# Patient Record
Sex: Male | Born: 1970 | Race: White | Hispanic: No | Marital: Married | Smoking: Current every day smoker
Health system: Southern US, Community
[De-identification: ages and names within clinical notes are randomized; demographics above are authoritative.]

## PROBLEM LIST (undated history)

## (undated) DIAGNOSIS — J189 Pneumonia, unspecified organism: Secondary | ICD-10-CM

## (undated) DIAGNOSIS — I10 Essential (primary) hypertension: Secondary | ICD-10-CM

## (undated) DIAGNOSIS — K219 Gastro-esophageal reflux disease without esophagitis: Secondary | ICD-10-CM

## (undated) DIAGNOSIS — E785 Hyperlipidemia, unspecified: Secondary | ICD-10-CM

## (undated) DIAGNOSIS — M542 Cervicalgia: Secondary | ICD-10-CM

## (undated) HISTORY — PX: TUMOR EXCISION: SHX421

---

## 2006-04-22 ENCOUNTER — Other Ambulatory Visit: Payer: Self-pay

## 2006-04-22 ENCOUNTER — Inpatient Hospital Stay: Payer: Self-pay | Admitting: Internal Medicine

## 2006-07-11 ENCOUNTER — Emergency Department: Payer: Self-pay | Admitting: Emergency Medicine

## 2008-10-04 ENCOUNTER — Emergency Department: Payer: Self-pay | Admitting: Emergency Medicine

## 2010-07-17 ENCOUNTER — Emergency Department: Payer: Self-pay | Admitting: Emergency Medicine

## 2011-01-12 ENCOUNTER — Emergency Department: Payer: Self-pay | Admitting: Emergency Medicine

## 2011-01-22 ENCOUNTER — Ambulatory Visit: Payer: Self-pay | Admitting: Internal Medicine

## 2011-01-26 ENCOUNTER — Ambulatory Visit: Payer: Self-pay | Admitting: Gastroenterology

## 2011-02-01 ENCOUNTER — Ambulatory Visit: Payer: Self-pay | Admitting: Gastroenterology

## 2011-06-19 ENCOUNTER — Emergency Department: Payer: Self-pay | Admitting: Emergency Medicine

## 2011-06-19 LAB — CBC
HCT: 46.2 % (ref 40.0–52.0)
HGB: 15.9 g/dL (ref 13.0–18.0)
MCH: 31.9 pg (ref 26.0–34.0)
MCHC: 34.4 g/dL (ref 32.0–36.0)
MCV: 93 fL (ref 80–100)
Platelet: 183 10*3/uL (ref 150–440)
RBC: 4.99 10*6/uL (ref 4.40–5.90)
WBC: 8.6 10*3/uL (ref 3.8–10.6)

## 2011-06-19 LAB — COMPREHENSIVE METABOLIC PANEL
Albumin: 4.1 g/dL (ref 3.4–5.0)
Alkaline Phosphatase: 105 U/L (ref 50–136)
Anion Gap: 12 (ref 7–16)
BUN: 12 mg/dL (ref 7–18)
Calcium, Total: 9.1 mg/dL (ref 8.5–10.1)
Creatinine: 0.99 mg/dL (ref 0.60–1.30)
Glucose: 103 mg/dL — ABNORMAL HIGH (ref 65–99)
Potassium: 3.3 mmol/L — ABNORMAL LOW (ref 3.5–5.1)
SGOT(AST): 20 U/L (ref 15–37)
SGPT (ALT): 18 U/L
Total Protein: 7.6 g/dL (ref 6.4–8.2)

## 2011-06-19 LAB — TROPONIN I: Troponin-I: <0.02 ng/mL

## 2011-06-19 LAB — CK TOTAL AND CKMB (NOT AT ARMC)
CK, Total: 72 U/L (ref 35–232)
CK-MB: <0.5 ng/mL — ABNORMAL LOW (ref 0.5–3.6)

## 2011-06-19 LAB — MAGNESIUM: Magnesium: 1.7 mg/dL — ABNORMAL LOW

## 2011-09-11 ENCOUNTER — Emergency Department: Payer: Self-pay | Admitting: Emergency Medicine

## 2011-09-11 LAB — BASIC METABOLIC PANEL
Anion Gap: 10 (ref 7–16)
BUN: 9 mg/dL (ref 7–18)
Calcium, Total: 9.1 mg/dL (ref 8.5–10.1)
Chloride: 109 mmol/L — ABNORMAL HIGH (ref 98–107)
Creatinine: 1.1 mg/dL (ref 0.60–1.30)
Glucose: 113 mg/dL — ABNORMAL HIGH (ref 65–99)
Osmolality: 286 (ref 275–301)

## 2011-09-11 LAB — CK TOTAL AND CKMB (NOT AT ARMC)
CK, Total: 115 U/L (ref 35–232)
CK-MB: <0.5 ng/mL — ABNORMAL LOW (ref 0.5–3.6)

## 2011-09-11 LAB — CBC
HGB: 15.5 g/dL (ref 13.0–18.0)
MCH: 32.8 pg (ref 26.0–34.0)
MCHC: 35 g/dL (ref 32.0–36.0)
MCV: 94 fL (ref 80–100)
RBC: 4.74 10*6/uL (ref 4.40–5.90)
WBC: 10.3 10*3/uL (ref 3.8–10.6)

## 2012-08-24 ENCOUNTER — Ambulatory Visit: Payer: Self-pay | Admitting: Internal Medicine

## 2013-01-15 ENCOUNTER — Emergency Department: Payer: Self-pay | Admitting: Emergency Medicine

## 2013-01-15 LAB — CBC
HGB: 16 g/dL (ref 13.0–18.0)
MCH: 32.8 pg (ref 26.0–34.0)
MCHC: 35.4 g/dL (ref 32.0–36.0)
MCV: 93 fL (ref 80–100)
RBC: 4.87 10*6/uL (ref 4.40–5.90)
RDW: 13.4 % (ref 11.5–14.5)

## 2013-01-15 LAB — COMPREHENSIVE METABOLIC PANEL
Albumin: 3.9 g/dL (ref 3.4–5.0)
Alkaline Phosphatase: 109 U/L (ref 50–136)
Anion Gap: 8 (ref 7–16)
Calcium, Total: 9.3 mg/dL (ref 8.5–10.1)
Chloride: 104 mmol/L (ref 98–107)
Creatinine: 0.94 mg/dL (ref 0.60–1.30)
EGFR (Non-African Amer.): >60
Glucose: 110 mg/dL — ABNORMAL HIGH (ref 65–99)
Osmolality: 277 (ref 275–301)
Potassium: 3.5 mmol/L (ref 3.5–5.1)
SGOT(AST): 12 U/L — ABNORMAL LOW (ref 15–37)
Sodium: 139 mmol/L (ref 136–145)
Total Protein: 7 g/dL (ref 6.4–8.2)

## 2013-01-15 LAB — TROPONIN I
Troponin-I: <0.02 ng/mL
Troponin-I: <0.02 ng/mL

## 2013-05-02 ENCOUNTER — Emergency Department: Payer: Self-pay | Admitting: Emergency Medicine

## 2013-05-02 LAB — CBC
HCT: 45.3 % (ref 40.0–52.0)
HGB: 15.7 g/dL (ref 13.0–18.0)
MCH: 32.8 pg (ref 26.0–34.0)
MCHC: 34.7 g/dL (ref 32.0–36.0)
MCV: 95 fL (ref 80–100)
PLATELETS: 179 10*3/uL (ref 150–440)
RBC: 4.8 10*6/uL (ref 4.40–5.90)
RDW: 13.6 % (ref 11.5–14.5)
WBC: 8.5 10*3/uL (ref 3.8–10.6)

## 2013-05-02 LAB — BASIC METABOLIC PANEL
Anion Gap: 7 (ref 7–16)
BUN: 7 mg/dL (ref 7–18)
CO2: 26 mmol/L (ref 21–32)
Calcium, Total: 9.1 mg/dL (ref 8.5–10.1)
Chloride: 106 mmol/L (ref 98–107)
Creatinine: 0.92 mg/dL (ref 0.60–1.30)
EGFR (Non-African Amer.): >60
Glucose: 106 mg/dL — ABNORMAL HIGH (ref 65–99)
Osmolality: 276 (ref 275–301)
POTASSIUM: 3.3 mmol/L — AB (ref 3.5–5.1)
Sodium: 139 mmol/L (ref 136–145)

## 2013-05-02 LAB — TROPONIN I: Troponin-I: <0.02 ng/mL

## 2013-05-20 ENCOUNTER — Emergency Department: Payer: Self-pay

## 2014-06-10 DIAGNOSIS — E785 Hyperlipidemia, unspecified: Secondary | ICD-10-CM | POA: Insufficient documentation

## 2014-06-10 DIAGNOSIS — G8929 Other chronic pain: Secondary | ICD-10-CM | POA: Insufficient documentation

## 2014-06-10 DIAGNOSIS — M542 Cervicalgia: Secondary | ICD-10-CM

## 2014-06-10 DIAGNOSIS — Z8711 Personal history of peptic ulcer disease: Secondary | ICD-10-CM | POA: Insufficient documentation

## 2015-01-13 ENCOUNTER — Ambulatory Visit
Admission: RE | Admit: 2015-01-13 | Discharge: 2015-01-13 | Disposition: A | Discharge status: Home / Self Care | Payer: BC Managed Care – PPO | Source: Ambulatory Visit | Attending: Physician Assistant | Admitting: Physician Assistant

## 2015-01-13 ENCOUNTER — Other Ambulatory Visit: Payer: Self-pay | Admitting: Physician Assistant

## 2015-01-13 DIAGNOSIS — I700 Atherosclerosis of aorta: Secondary | ICD-10-CM | POA: Diagnosis not present

## 2015-01-13 DIAGNOSIS — K449 Diaphragmatic hernia without obstruction or gangrene: Secondary | ICD-10-CM | POA: Insufficient documentation

## 2015-01-13 DIAGNOSIS — R1031 Right lower quadrant pain: Secondary | ICD-10-CM

## 2015-01-13 DIAGNOSIS — R1030 Lower abdominal pain, unspecified: Secondary | ICD-10-CM

## 2015-01-13 DIAGNOSIS — K5730 Diverticulosis of large intestine without perforation or abscess without bleeding: Secondary | ICD-10-CM | POA: Insufficient documentation

## 2015-01-13 DIAGNOSIS — R102 Pelvic and perineal pain: Secondary | ICD-10-CM

## 2015-01-13 MED ORDER — IOHEXOL 300 MG/ML  SOLN
100.0000 mL | Freq: Once | INTRAMUSCULAR | Status: AC | PRN
  Administered 2015-01-13: 100 mL via INTRAVENOUS

## 2015-02-02 IMAGING — US THYROID ULTRASOUND
1 series · 14 of 25 positions shown · non-contrast
Comparison: none

REASON FOR EXAM: Left Lower Lobe Tenderness Hx Thyroid Nodule
COMMENTS:

PROCEDURE:     TIGER - TIGER SOFT TISSUE HEAD/NECK/THYROI  - August 24, 2012  [DATE]
RESULT:     Comparison: None
TECHNIQUE: Multiple gray-scale and color-flow Doppler images of the thyroid
gland were obtained.

[Series 1: thyroid ultrasound · 0.08mm/px · 14 of 51 slices shown]
[im 1/51]
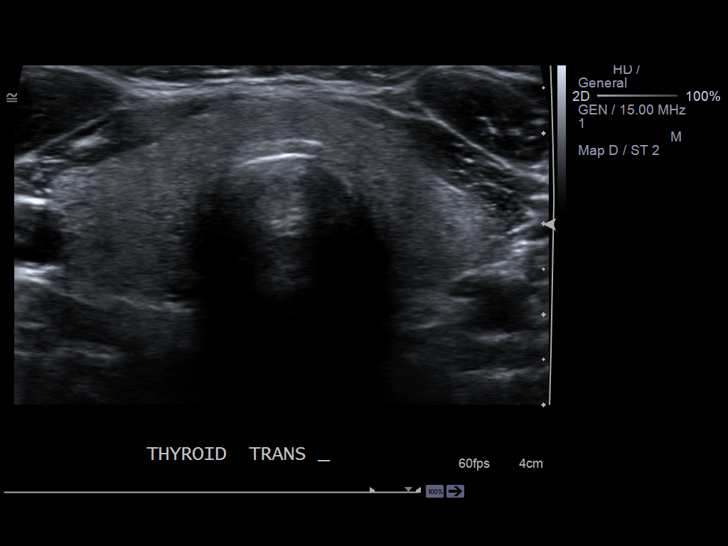
[im 5/51]
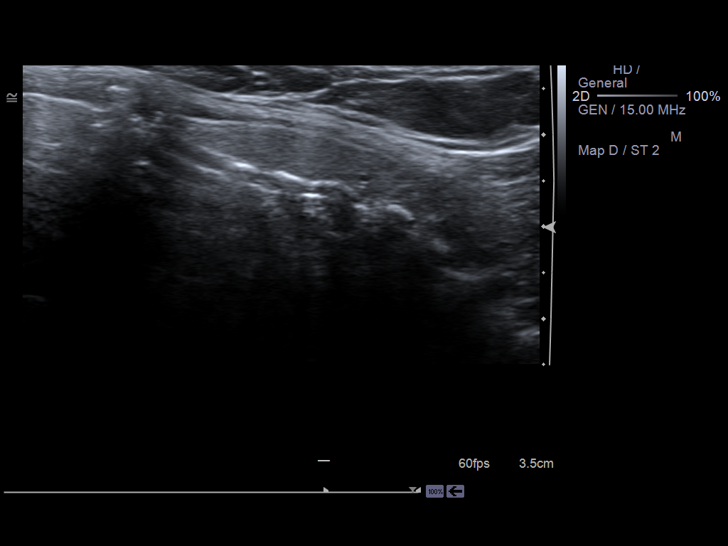
[im 9/51]
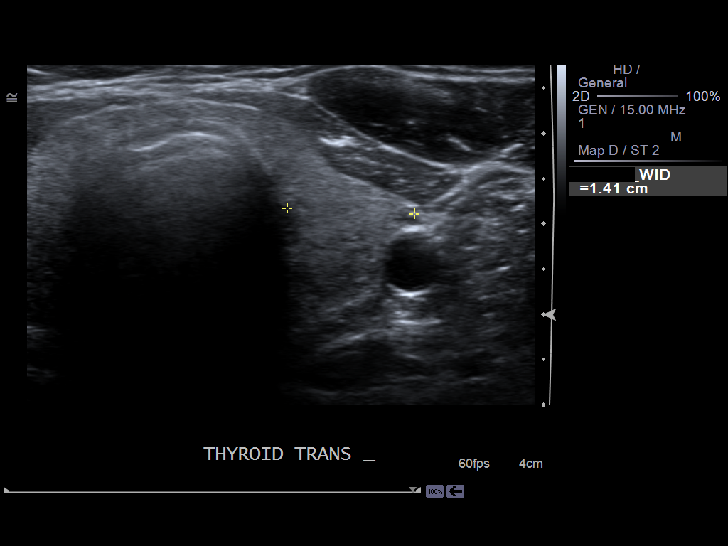
[im 13/51]
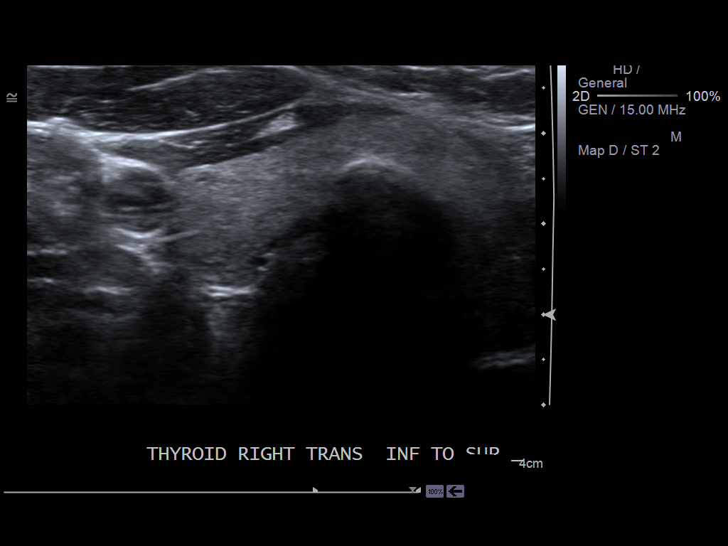
[im 17/51]
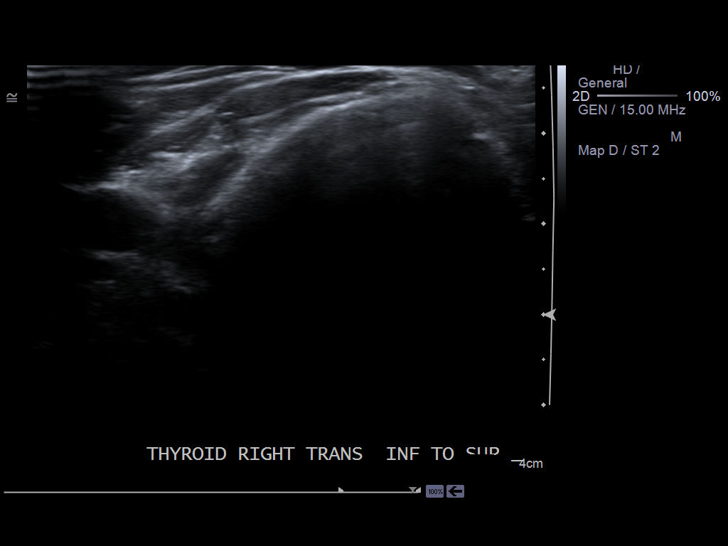
[im 19/51]
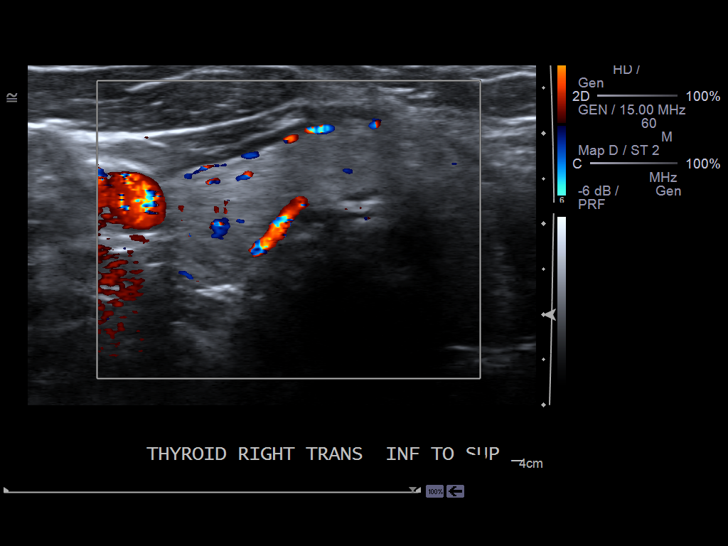
[im 23/51]
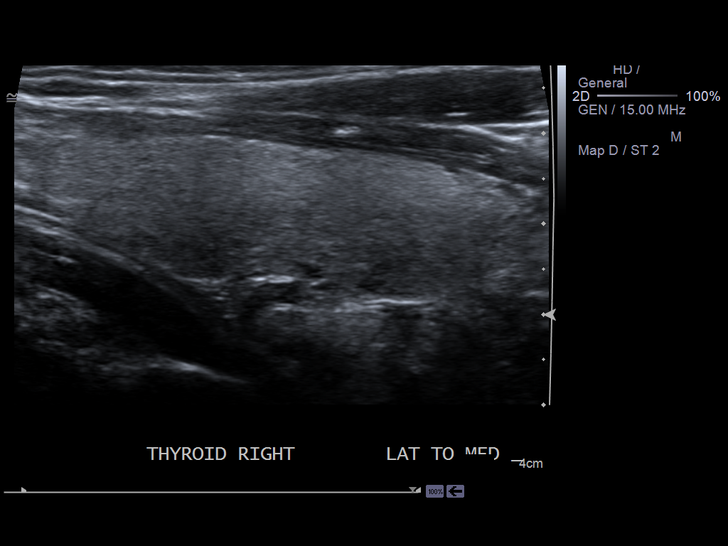
[im 28/51]
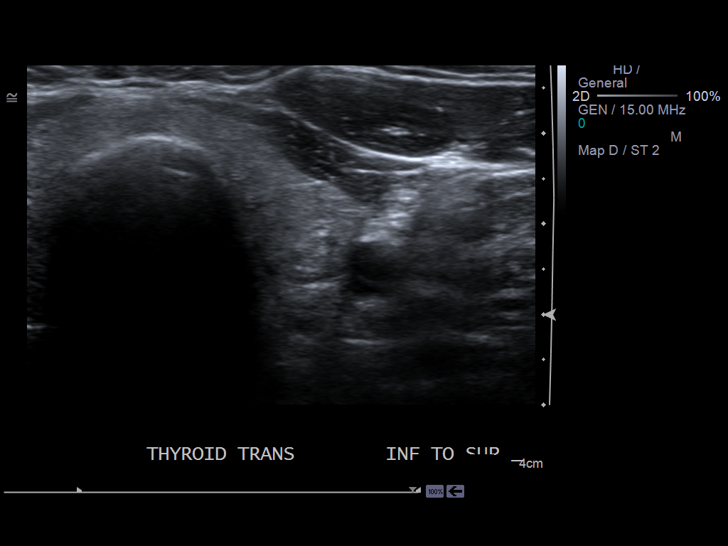
[im 32/51]
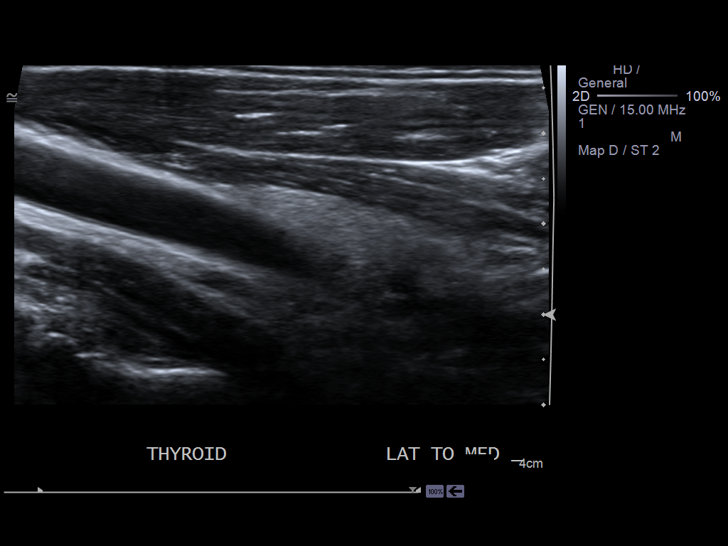
[im 34/51]
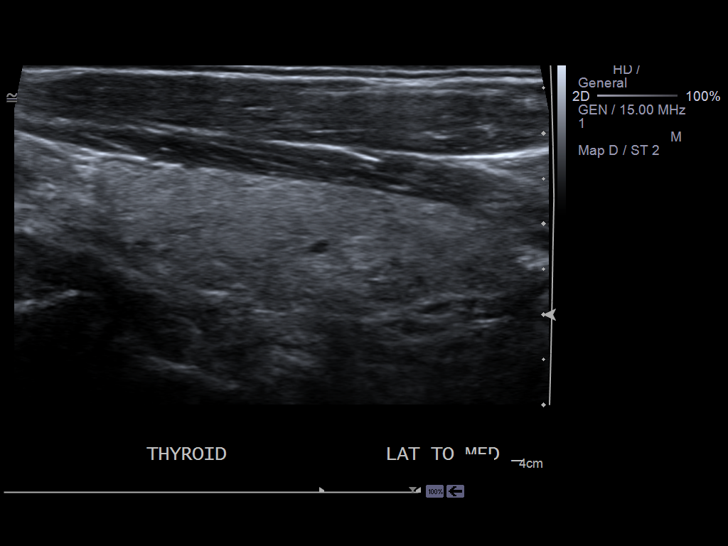
[im 38/51]
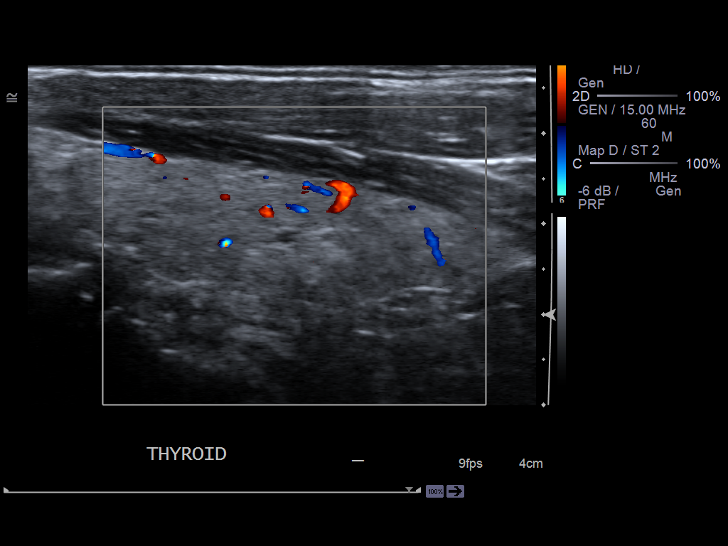
[im 42/51]
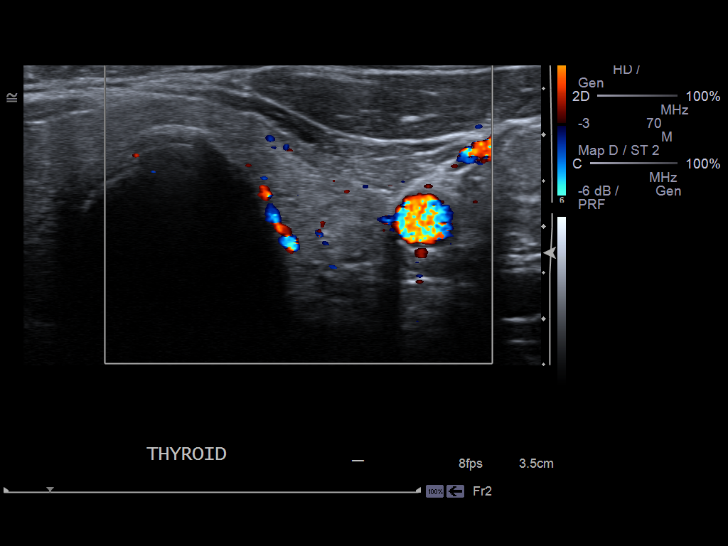
[im 46/51]
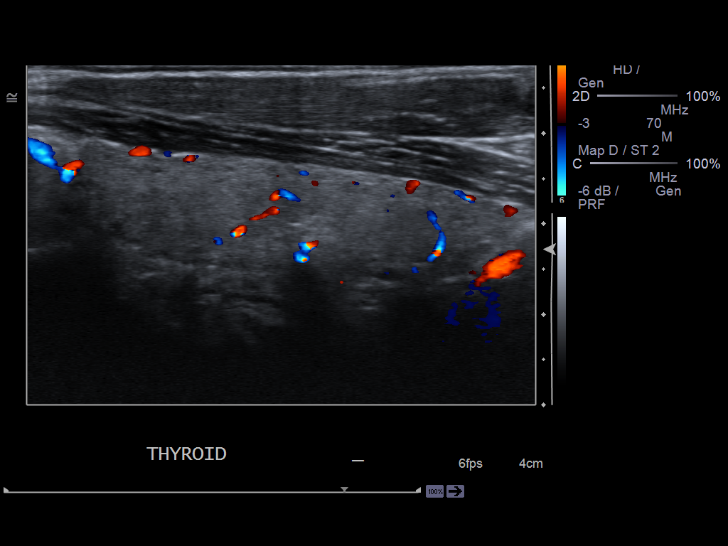
[im 51/51]
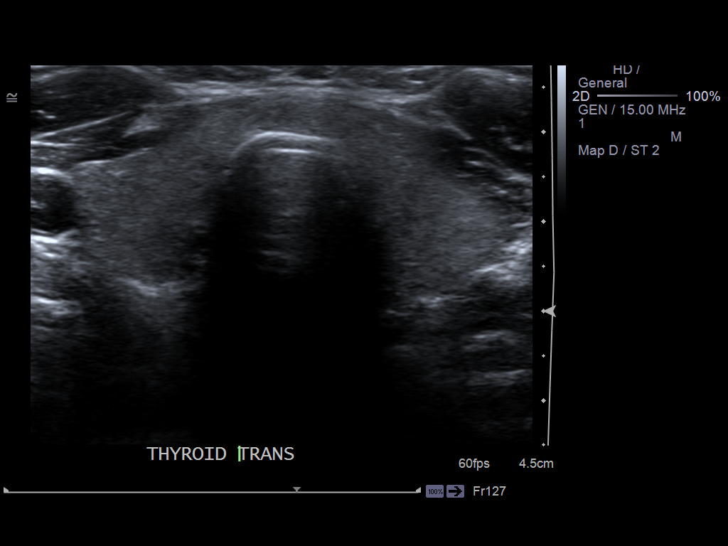

[14 of 25 positions shown; findings below may reference images not displayed]

FINDINGS: The right lobe the thyroid measures 5.8 x 1.6 x 1.5 cm. The left lobe of the
thyroid measures 5.4 x 1.7 x 1.4 cm. The thyroid isthmus measures 0.5 cm in
thickness. The thyroid gland is homogeneous in echotexture. There is a small
solid slightly hyperechoic mass in the inferior pole of the left lobe of the
thyroid. It measures 0.7 x 0.5 x 0.5 cm.
IMPRESSION: Subcentimeter solid nodule in the left lobe of the thyroid gland, which is
nonspecific.

Please note, there are no sensitive or specific ultrasound criteria for
differentiating benign from malignant thyroid lesions.

[REDACTED]

## 2017-02-16 ENCOUNTER — Encounter: Payer: Self-pay | Admitting: Emergency Medicine

## 2017-02-16 ENCOUNTER — Emergency Department: Payer: BC Managed Care – PPO

## 2017-02-16 ENCOUNTER — Observation Stay
Admission: EM | Admit: 2017-02-16 | Discharge: 2017-02-17 | Disposition: A | Discharge status: Home / Self Care | Payer: BC Managed Care – PPO | Attending: Internal Medicine | Admitting: Internal Medicine

## 2017-02-16 ENCOUNTER — Other Ambulatory Visit: Payer: Self-pay

## 2017-02-16 DIAGNOSIS — G629 Polyneuropathy, unspecified: Secondary | ICD-10-CM | POA: Diagnosis not present

## 2017-02-16 DIAGNOSIS — E785 Hyperlipidemia, unspecified: Secondary | ICD-10-CM | POA: Diagnosis not present

## 2017-02-16 DIAGNOSIS — F17210 Nicotine dependence, cigarettes, uncomplicated: Secondary | ICD-10-CM | POA: Insufficient documentation

## 2017-02-16 DIAGNOSIS — R0789 Other chest pain: Secondary | ICD-10-CM | POA: Diagnosis not present

## 2017-02-16 DIAGNOSIS — J9811 Atelectasis: Secondary | ICD-10-CM | POA: Insufficient documentation

## 2017-02-16 DIAGNOSIS — R0602 Shortness of breath: Secondary | ICD-10-CM | POA: Insufficient documentation

## 2017-02-16 DIAGNOSIS — K219 Gastro-esophageal reflux disease without esophagitis: Secondary | ICD-10-CM | POA: Diagnosis present

## 2017-02-16 DIAGNOSIS — R079 Chest pain, unspecified: Secondary | ICD-10-CM | POA: Diagnosis present

## 2017-02-16 DIAGNOSIS — Z79899 Other long term (current) drug therapy: Secondary | ICD-10-CM | POA: Insufficient documentation

## 2017-02-16 HISTORY — DX: Hyperlipidemia, unspecified: E78.5

## 2017-02-16 HISTORY — DX: Gastro-esophageal reflux disease without esophagitis: K21.9

## 2017-02-16 HISTORY — DX: Cervicalgia: M54.2

## 2017-02-16 LAB — BASIC METABOLIC PANEL
ANION GAP: 12 (ref 5–15)
BUN: 6 mg/dL (ref 6–20)
CALCIUM: 9.7 mg/dL (ref 8.9–10.3)
CO2: 22 mmol/L (ref 22–32)
CREATININE: 0.84 mg/dL (ref 0.61–1.24)
Chloride: 103 mmol/L (ref 101–111)
GFR calc Af Amer: >60 mL/min (ref >60)
GFR calc non Af Amer: >60 mL/min (ref >60)
GLUCOSE: 136 mg/dL — AB (ref 65–99)
Potassium: 3.3 mmol/L — ABNORMAL LOW (ref 3.5–5.1)
Sodium: 137 mmol/L (ref 135–145)

## 2017-02-16 LAB — TROPONIN I

## 2017-02-16 LAB — CBC
HCT: 49.2 % (ref 40.0–52.0)
HEMOGLOBIN: 16.6 g/dL (ref 13.0–18.0)
MCH: 31.7 pg (ref 26.0–34.0)
MCHC: 33.7 g/dL (ref 32.0–36.0)
MCV: 94.1 fL (ref 80.0–100.0)
Platelets: UNDETERMINED 10*3/uL (ref 150–440)
RBC: 5.23 MIL/uL (ref 4.40–5.90)
RDW: 13.7 % (ref 11.5–14.5)
WBC: 11.6 10*3/uL — ABNORMAL HIGH (ref 3.8–10.6)

## 2017-02-16 MED ORDER — IOPAMIDOL (ISOVUE-370) INJECTION 76%
100.0000 mL | Freq: Once | INTRAVENOUS | Status: AC | PRN
  Administered 2017-02-16: 100 mL via INTRAVENOUS

## 2017-02-16 MED ORDER — OXYCODONE HCL 5 MG PO TABS: 5.0000 mg | ORAL_TABLET | ORAL | Status: DC | PRN

## 2017-02-16 MED ORDER — ACETAMINOPHEN 650 MG RE SUPP: 650.0000 mg | Freq: Four times a day (QID) | RECTAL | Status: DC | PRN

## 2017-02-16 MED ORDER — SODIUM CHLORIDE 0.9 % IV BOLUS (SEPSIS)
500.0000 mL | Freq: Once | INTRAVENOUS | Status: AC
  Administered 2017-02-16: 500 mL via INTRAVENOUS

## 2017-02-16 MED ORDER — ENOXAPARIN SODIUM 40 MG/0.4ML ~~LOC~~ SOLN: 40.0000 mg | SUBCUTANEOUS | Status: DC

## 2017-02-16 MED ORDER — PANTOPRAZOLE SODIUM 40 MG PO TBEC
40.0000 mg | DELAYED_RELEASE_TABLET | Freq: Two times a day (BID) | ORAL | Status: DC
  Administered 2017-02-17 (×2): 40 mg via ORAL
  Filled 2017-02-16 (×2): qty 1

## 2017-02-16 MED ORDER — ACETAMINOPHEN 325 MG PO TABS: 650.0000 mg | ORAL_TABLET | Freq: Four times a day (QID) | ORAL | Status: DC | PRN

## 2017-02-16 MED ORDER — NITROGLYCERIN 0.4 MG SL SUBL
0.4000 mg | SUBLINGUAL_TABLET | SUBLINGUAL | Status: DC | PRN
  Administered 2017-02-16 – 2017-02-17 (×4): 0.4 mg via SUBLINGUAL
  Filled 2017-02-16 (×3): qty 1

## 2017-02-16 MED ORDER — GABAPENTIN 100 MG PO CAPS
100.0000 mg | ORAL_CAPSULE | Freq: Two times a day (BID) | ORAL | Status: DC
Start: 2017-02-17 — End: 2017-02-17
  Administered 2017-02-17 (×2): 100 mg via ORAL
  Filled 2017-02-16 (×2): qty 1

## 2017-02-16 MED ORDER — ONDANSETRON HCL 4 MG PO TABS: 4.0000 mg | ORAL_TABLET | Freq: Four times a day (QID) | ORAL | Status: DC | PRN

## 2017-02-16 MED ORDER — ASPIRIN 81 MG PO CHEW
324.0000 mg | CHEWABLE_TABLET | Freq: Once | ORAL | Status: AC
  Administered 2017-02-16: 324 mg via ORAL
  Filled 2017-02-16: qty 4

## 2017-02-16 MED ORDER — ONDANSETRON HCL 4 MG/2ML IJ SOLN
4.0000 mg | Freq: Once | INTRAMUSCULAR | Status: DC
  Filled 2017-02-16: qty 2

## 2017-02-16 MED ORDER — ONDANSETRON HCL 4 MG/2ML IJ SOLN: 4.0000 mg | Freq: Four times a day (QID) | INTRAMUSCULAR | Status: DC | PRN

## 2017-02-16 MED ORDER — MORPHINE SULFATE (PF) 4 MG/ML IV SOLN
4.0000 mg | Freq: Once | INTRAVENOUS | Status: DC
  Filled 2017-02-16: qty 1

## 2017-02-16 NOTE — ED Triage Notes (Signed)
Patient with complaint of chest pain with shortness of breath and vomiting that started about an hour ago.

## 2017-02-16 NOTE — H&P (Signed)
[facility] -  at [facility]   PATIENT NAME: Raymond Hamilton    MR#:  [id number]  DATE OF BIRTH:  1971/01/22  DATE OF ADMISSION:  02/16/2017  PRIMARY CARE PHYSICIAN: Rafael Bihari, MD   REQUESTING/REFERRING PHYSICIAN: Pershing Proud, MD  CHIEF COMPLAINT:   Chief Complaint  Patient presents with  . Chest Pain    HISTORY OF PRESENT ILLNESS:  Raymond Hamilton  is a 46 y.o. male who presents with persistent chest pain.  Patient states his chest pain started yesterday evening, he was not doing any particular activity.  It eased off some but then today came back more intensely.  It was a central chest pain initially, and aching pain, radiated down to his left lateral side and then around to his back.  Initial workup here in the ED is largely within normal limits.  Patient does have a past history of reflux, but he states that this pain is different from any reflux pain he has had before.  Hospitalist were called for admission and further evaluation  PAST MEDICAL HISTORY:   Past Medical History:  Diagnosis Date  . GERD (gastroesophageal reflux disease)   . HLD (hyperlipidemia)   . Neck pain     PAST SURGICAL HISTORY:   Past Surgical History:  Procedure Laterality Date  . TUMOR EXCISION      SOCIAL HISTORY:   Social History   Tobacco Use  . Smoking status: Current Every Day Smoker  . Smokeless tobacco: Never Used  Substance Use Topics  . Alcohol use: Yes    Comment: occ    FAMILY HISTORY:   Family History  Problem Relation Age of Onset  . Hypertension Mother   . Diabetes Father     DRUG ALLERGIES:  No Known Allergies  MEDICATIONS AT HOME:   Prior to Admission medications   Medication Sig Start Date End Date Taking? Authorizing Provider  omeprazole (PRILOSEC) 40 MG capsule Take 1 capsule 2 (two) times daily by mouth. 10/01/16 10/01/17 Yes [provider]  gabapentin (NEURONTIN) 100 MG capsule TAKE 1 CAPSULE (100 MG TOTAL) BY  MOUTH 2 (TWO) TIMES DAILY. 12/22/16   [provider]    REVIEW OF SYSTEMS:  Review of Systems  Constitutional: Negative for chills, fever, malaise/fatigue and weight loss.  HENT: Negative for ear pain, hearing loss and tinnitus.   Eyes: Negative for blurred vision, double vision, pain and redness.  Respiratory: Negative for cough, hemoptysis and shortness of breath.   Cardiovascular: Positive for chest pain. Negative for palpitations, orthopnea and leg swelling.  Gastrointestinal: Negative for abdominal pain, constipation, diarrhea, nausea and vomiting.  Genitourinary: Negative for dysuria, frequency and hematuria.  Musculoskeletal: Negative for back pain, joint pain and neck pain.  Skin:       No acne, rash, or lesions  Neurological: Negative for dizziness, tremors, focal weakness and weakness.  Endo/Heme/Allergies: Negative for polydipsia. Does not bruise/bleed easily.  Psychiatric/Behavioral: Negative for depression. The patient is not nervous/anxious and does not have insomnia.      VITAL SIGNS:   Vitals:   02/16/17 2004  BP: (!) 157/90  Pulse: 94  Resp: (!) 22  Temp: 97.8 F (36.6 C)  TempSrc: Oral  SpO2: 99%  Weight: 89.4 kg (197 lb)  Height: 6\' 1"  (1.854 m)   Wt Readings from Last 3 Encounters:  02/16/17 89.4 kg (197 lb)    PHYSICAL EXAMINATION:  Physical Exam  Vitals reviewed. Constitutional: He is oriented to person, place, and  time. He appears well-developed and well-nourished. No distress.  HENT:  Head: Normocephalic and atraumatic.  Mouth/Throat: Oropharynx is clear and moist.  Eyes: Conjunctivae and EOM are normal. Pupils are equal, round, and reactive to light. No scleral icterus.  Neck: Normal range of motion. Neck supple. No JVD present. No thyromegaly present.  Cardiovascular: Normal rate, regular rhythm and intact distal pulses. Exam reveals no gallop and no friction rub.  No murmur heard. Respiratory: Effort normal and breath sounds normal.  No respiratory distress. He has no wheezes. He has no rales.  GI: Soft. Bowel sounds are normal. He exhibits no distension. There is no tenderness.  Musculoskeletal: Normal range of motion. He exhibits no edema.  No arthritis, no gout  Lymphadenopathy:    He has no cervical adenopathy.  Neurological: He is alert and oriented to person, place, and time. No cranial nerve deficit.  No dysarthria, no aphasia  Skin: Skin is warm and dry. No rash noted. No erythema.  Psychiatric: He has a normal mood and affect. His behavior is normal. Judgment and thought content normal.    LABORATORY PANEL:   CBC Recent Labs  Lab 02/16/17 2020  WBC 11.6*  HGB 16.6  HCT 49.2  PLT PLATELET CLUMPS NOTED ON SMEAR, UNABLE TO ESTIMATE   ------------------------------------------------------------------------------------------------------------------  Chemistries  Recent Labs  Lab 02/16/17 2020  NA 137  K 3.3*  CL 103  CO2 22  GLUCOSE 136*  BUN 6  CREATININE 0.84  CALCIUM 9.7   ------------------------------------------------------------------------------------------------------------------  Cardiac Enzymes Recent Labs  Lab 02/16/17 2020  TROPONINI <0.03   ------------------------------------------------------------------------------------------------------------------  RADIOLOGY:  Dg Chest 2 View  Result Date: 02/16/2017 CLINICAL DATA:  Chest pain radiating into the left side with shortness of breath and vomiting today. History of smoking and gastroesophageal reflux disease. EXAM: CHEST  2 VIEW COMPARISON:  Chest radiographs 05/02/2013 and 01/15/2013. FINDINGS: The heart size and mediastinal contours are normal. The lungs are clear. There is no pleural effusion or pneumothorax. No acute osseous findings are identified. IMPRESSION: Stable chest.  No active cardiopulmonary process. Electronically Signed   By: Carey BullocksWilliam  Veazey M.D.   On: 02/16/2017 20:23   Ct Angio Chest Aorta W And/or Wo  Contrast  Result Date: 02/16/2017 CLINICAL DATA:  Acute onset of generalized chest pain, shortness of breath and vomiting. EXAM: CT ANGIOGRAPHY CHEST WITH CONTRAST TECHNIQUE: Multidetector CT imaging of the chest was performed using the standard protocol during bolus administration of intravenous contrast. Multiplanar CT image reconstructions and MIPs were obtained to evaluate the vascular anatomy. CONTRAST:  100mL ISOVUE-370 IOPAMIDOL (ISOVUE-370) INJECTION 76% COMPARISON:  Chest radiograph performed earlier today at 8:10 p.m., and CT of the chest performed 06/19/2011 FINDINGS: Cardiovascular: There is no evidence of aortic dissection. There is no evidence of aneurysmal dilatation. There is no evidence of significant pulmonary embolus. The heart is normal in size. The thoracic aorta is grossly unremarkable. The great vessels are within normal limits. Mediastinum/Nodes: The mediastinum is unremarkable appearance. No mediastinal lymphadenopathy is seen. No pericardial effusion is identified. The visualized portions of the thyroid gland are unremarkable. No axillary lymphadenopathy is seen. There is mild nonspecific wall thickening at the distal esophagus. Lungs/Pleura: A small lymph node is noted along the superior aspect of the left major fissure. Minimal left basilar atelectasis is noted. No pleural effusion or pneumothorax is seen. No masses are identified. Upper Abdomen: The visualized portions of the liver and spleen are unremarkable. The visualized portions of the gallbladder, pancreas, adrenal glands and kidneys are  within normal limits. Musculoskeletal: No acute osseous abnormalities are identified. The visualized musculature is unremarkable in appearance. Review of the MIP images confirms the above findings. IMPRESSION: 1. No evidence of aortic dissection. No evidence of aneurysmal dilatation. 2. No evidence of significant pulmonary embolus. 3. Minimal left basilar atelectasis.  Lungs otherwise clear. 4.  Mild nonspecific wall thickening at the distal esophagus. This may reflect chronic inflammation. Would correlate clinically for any evidence of esophagitis. Electronically Signed   By: Roanna RaiderJeffery  Chang M.D.   On: 02/16/2017 22:25    EKG:   Orders placed or performed during the hospital encounter of 02/16/17  . EKG 12-Lead  . EKG 12-Lead  . ED EKG within 10 minutes  . ED EKG within 10 minutes    IMPRESSION AND PLAN:  Principal Problem:   Chest pain -initial troponin negative, will cycle his cardiac enzymes, get an echocardiogram and a cardiology consult Active Problems:   GERD (gastroesophageal reflux disease) -home dose PPI  All the records are reviewed and case discussed with ED provider. Management plans discussed with the patient and/or family.  DVT PROPHYLAXIS: SubQ lovenox  GI PROPHYLAXIS: PPI  ADMISSION STATUS: Observation  CODE STATUS: Full Code Status History    This patient does not have a recorded code status. Please follow your organizational policy for patients in this situation.      TOTAL TIME TAKING CARE OF THIS PATIENT: 40 minutes.   Foy Vanduyne FIELDING 02/16/2017, 10:32 PM  [facility]  Office  [phone]  CC: Primary care physician; Rafael BihariWalker, [name], MD  Note:  This document was prepared using Dragon voice recognition software and may include unintentional dictation errors.

## 2017-02-16 NOTE — ED Provider Notes (Addendum)
[facility] Emergency Department Provider Note  ____________________________________________   First MD Initiated Contact with Patient 02/16/17 2021     (approximate)  I have reviewed the triage vital signs and the nursing notes.   HISTORY  Chief Complaint Chest Pain   HPI Raymond Hamilton is a 46 y.o. male with a history of GERD and neck pain was presented to the emergency department with chest pain across his chest and radiating to his left back and flank over the past 1 day.  He says the pain feels like a pressure and is a 10 out of 10 at this time.  Associated with shortness of breath.  Nausea but no vomiting.  Says that it gets worse when he walks.  No history of cardiac disease but says that his mother had peripheral vascular disease.  Patient denies any injury or heavy lifting.  Says that he smokes but does not use any drugs and does not drink.  Says that the pain has been coming and going but this last episode that is a 10 out of 10 started about an hour ago.  Past Medical History:  Diagnosis Date  . GERD (gastroesophageal reflux disease)   . Neck pain     There are no active problems to display for this patient.   No past surgical history on file.  Prior to Admission medications   Not on File    Allergies Patient has no known allergies.  No family history on file.  Social History Social History   Tobacco Use  . Smoking status: Current Every Day Smoker  . Smokeless tobacco: Never Used  Substance Use Topics  . Alcohol use: Yes    Comment: occ  . Drug use: No    Review of Systems  Constitutional: No fever/chills Eyes: No visual changes. ENT: No sore throat. Cardiovascular: Above Respiratory: Above Gastrointestinal: No abdominal pain.   no vomiting.  No diarrhea.  No constipation. Genitourinary: Negative for dysuria. Musculoskeletal: Above Skin: Negative for rash. Neurological: Negative for headaches, focal weakness or  numbness.   ____________________________________________   PHYSICAL EXAM:  VITAL SIGNS: ED Triage Vitals [02/16/17 2004]  Enc Vitals Group     BP (!) 157/90     Pulse Rate 94     Resp (!) 22     Temp 97.8 F (36.6 C)     Temp Source Oral     SpO2 99 %     Weight 197 lb (89.4 kg)     Height 6\' 1"  (1.854 m)     Head Circumference      Peak Flow      Pain Score 10     Pain Loc      Pain Edu?      Excl. in GC?     Constitutional: Alert and oriented. in no acute distress. Eyes: Conjunctivae are normal.  Head: Atraumatic. Nose: No congestion/rhinnorhea. Mouth/Throat: Mucous membranes are moist.  Neck: No stridor.   Cardiovascular: Normal rate, regular rhythm. Grossly normal heart sounds.  Good peripheral circulation with equal, bilateral radial as well as dorsalis pedis pulses.  Chest very tender to palpation across the anterior chest.  No crepitus.  No deformity.  Respiratory: Normal respiratory effort.  No retractions. Lungs CTAB. Gastrointestinal: Soft and nontender. No distention. Musculoskeletal: No lower extremity tenderness nor edema.  No joint effusions. Neurologic:  Normal speech and language. No gross focal neurologic deficits are appreciated. Skin:  Skin is warm, dry and intact. No rash  noted. Psychiatric: Mood and affect are normal. Speech and behavior are normal.  ____________________________________________   LABS (all labs ordered are listed, but only abnormal results are displayed)  Labs Reviewed  BASIC METABOLIC PANEL - Abnormal; Notable for the following components:      Result Value   Potassium 3.3 (*)    Glucose, Bld 136 (*)    All other components within normal limits  CBC - Abnormal; Notable for the following components:   WBC 11.6 (*)    All other components within normal limits  TROPONIN I   ____________________________________________  EKG  ED ECG REPORT I, Arelia LongestSchaevitz,  David M, the attending physician, personally viewed and  interpreted this ECG.   Date: 02/16/2017  EKG Time: 2001  Rate: 99  Rhythm: normal sinus rhythm  Axis: Normal  Intervals:none  ST&T Change: No ST segment elevation or depression.  No abnormal T wave inversion.  ED ECG REPORT I, Arelia LongestSchaevitz,  David M, the attending physician, personally viewed and interpreted this ECG.   Date: 02/16/2017  EKG Time: 2038  Rate: 84  Rhythm: normal sinus rhythm  Axis: Normal  Intervals:none  ST&T Change: No ST segment elevation or depression.  No abnormal T wave inversion.  EKG machine read as atrial flutter but this is likely related to baseline static.  ____________________________________________  RADIOLOGY  No acute finding on the chest x-ray ____________________________________________   PROCEDURES  Procedure(s) performed:   Procedures  Critical Care performed:   ____________________________________________   INITIAL IMPRESSION / ASSESSMENT AND PLAN / ED COURSE  Pertinent labs & imaging results that were available during my care of the patient were reviewed by me and considered in my medical decision making (see chart for details).  Differential diagnosis includes, but is not limited to, ACS, aortic dissection, pulmonary embolism, cardiac tamponade, pneumothorax, pneumonia, pericarditis, myocarditis, GI-related causes including esophagitis/gastritis, and musculoskeletal chest wall pain.    As part of my medical decision making, I reviewed the following data within the electronic MEDICAL RECORD NUMBER Notes from prior visits     ----------------------------------------- 9:57 PM on 02/16/2017 -----------------------------------------  Patient at this time with a chest pain that is only 2 out of 10.  Patient is calm and without any distress.  However, now complaining of left lower thoracic pain.  The patient with normal mediastinum on his chest x-ray.  Pulses equal in bilateral to both the upper and lower extremities.  Less likely to  be aortic dissection.  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  However, I feel the patient will likely be admitted to the hospital for chest pain workup.  [place].  He has had chest pressure that was relieved by 2 nitroglycerin.  Signed out to Dr. Anne HahnWillis.  ____________________________________________   FINAL CLINICAL IMPRESSION(S) / ED DIAGNOSES  Chest pain.    NEW MEDICATIONS STARTED DURING THIS VISIT:  This SmartLink is deprecated. Use AVSMEDLIST instead to display the medication list for a patient.   Note:  This document was prepared using Dragon voice recognition software and may include unintentional dictation errors.     Myrna BlazerSchaevitz, David Matthew, MD 02/16/17 2121    Myrna BlazerSchaevitz, David Matthew, MD 02/16/17 2158

## 2017-02-17 ENCOUNTER — Other Ambulatory Visit: Payer: Self-pay

## 2017-02-17 ENCOUNTER — Observation Stay (HOSPITAL_BASED_OUTPATIENT_CLINIC_OR_DEPARTMENT_OTHER)
Admit: 2017-02-17 | Discharge: 2017-02-17 | Disposition: A | Discharge status: Home / Self Care | Payer: BC Managed Care – PPO | Attending: Cardiovascular Disease | Admitting: Cardiovascular Disease

## 2017-02-17 DIAGNOSIS — F17200 Nicotine dependence, unspecified, uncomplicated: Secondary | ICD-10-CM

## 2017-02-17 DIAGNOSIS — R0782 Intercostal pain: Secondary | ICD-10-CM | POA: Diagnosis not present

## 2017-02-17 DIAGNOSIS — I5030 Unspecified diastolic (congestive) heart failure: Secondary | ICD-10-CM

## 2017-02-17 DIAGNOSIS — K210 Gastro-esophageal reflux disease with esophagitis: Secondary | ICD-10-CM

## 2017-02-17 LAB — CBC
HEMATOCRIT: 45.7 % (ref 40.0–52.0)
HEMOGLOBIN: 15.5 g/dL (ref 13.0–18.0)
MCH: 32.2 pg (ref 26.0–34.0)
MCHC: 34 g/dL (ref 32.0–36.0)
MCV: 94.6 fL (ref 80.0–100.0)
Platelets: 69 10*3/uL — ABNORMAL LOW (ref 150–440)
RBC: 4.83 MIL/uL (ref 4.40–5.90)
RDW: 13.3 % (ref 11.5–14.5)
WBC: 11 10*3/uL — ABNORMAL HIGH (ref 3.8–10.6)

## 2017-02-17 LAB — TROPONIN I: Troponin I: <0.03 ng/mL (ref <0.03)

## 2017-02-17 LAB — BASIC METABOLIC PANEL
ANION GAP: 7 (ref 5–15)
BUN: 7 mg/dL (ref 6–20)
CO2: 26 mmol/L (ref 22–32)
Calcium: 9 mg/dL (ref 8.9–10.3)
Chloride: 106 mmol/L (ref 101–111)
Creatinine, Ser: 0.8 mg/dL (ref 0.61–1.24)
GFR calc Af Amer: >60 mL/min (ref >60)
GFR calc non Af Amer: >60 mL/min (ref >60)
GLUCOSE: 105 mg/dL — AB (ref 65–99)
POTASSIUM: 3.8 mmol/L (ref 3.5–5.1)
Sodium: 139 mmol/L (ref 135–145)

## 2017-02-17 LAB — ECHOCARDIOGRAM COMPLETE
Height: 73 in
Weight: 3003.2 oz

## 2017-02-17 MED ORDER — OXYCODONE HCL 5 MG PO TABS: 5.0000 mg | ORAL_TABLET | ORAL | 0 refills | Status: DC | PRN

## 2017-02-17 MED ORDER — METAXALONE 800 MG PO TABS: 800.0000 mg | ORAL_TABLET | Freq: Three times a day (TID) | ORAL | 0 refills | Status: AC

## 2017-02-17 NOTE — Progress Notes (Signed)
Patient echo is within normal range, spoke with echo technician,Raymond Hamilton patient stable for discharge, follow-up with PCP in 1 week

## 2017-02-17 NOTE — Plan of Care (Signed)
Pain free since arrival to unit.

## 2017-02-17 NOTE — Progress Notes (Signed)
CH response to consult request for HCPOA. Patient was alert and was educated about the HCPOA. Patient would like to talk it over and [facility] Patient stated he was having chest pains and all test were clear and they are ordering another one for today. Patient stated he lost his uncle earlier and that it was expected. CH and patient discussed how he was coping with the loss. CH is available for follow-up. CH provided education, emotional support, and prayer.    02/17/17 0900  Clinical Encounter Type  Visited With Patient;Family  Visit Type Initial;Spiritual support  Referral From Nurse

## 2017-02-17 NOTE — Progress Notes (Signed)
*  PRELIMINARY RESULTS* Echocardiogram 2D Echocardiogram has been performed.  Raymond Hamilton, Raymond Hamilton 02/17/2017, 4:07 PM

## 2017-02-17 NOTE — Plan of Care (Signed)
  Progressing Education: Knowledge of General Education information will improve 02/17/2017 1556 - Progressing by Tomie ChinaJackson, Ami Thornsberry Cecelie, RN Clinical Measurements: Ability to maintain clinical measurements within normal limits will improve 02/17/2017 1556 - Progressing by Tomie ChinaJackson, Valentino Saavedra Cecelie, RN Will remain free from infection 02/17/2017 1556 - Progressing by Tomie ChinaJackson, Oaklyn Jakubek Cecelie, RN Diagnostic test results will improve 02/17/2017 1556 - Progressing by Tomie ChinaJackson, Markie Frith Cecelie, RN Respiratory complications will improve 02/17/2017 1556 - Progressing by Tomie ChinaJackson, Kalaysia Demonbreun Cecelie, RN Cardiovascular complication will be avoided 02/17/2017 1556 - Progressing by Tomie ChinaJackson, Jerzey Komperda Cecelie, RN Pain Managment: General experience of comfort will improve 02/17/2017 1556 - Progressing by Tomie ChinaJackson, Jailene Cupit Cecelie, RN Cardiac: Ability to achieve and maintain adequate cardiovascular perfusion will improve 02/17/2017 1556 - Progressing by Tomie ChinaJackson, Kiannah Grunow Cecelie, RN

## 2017-02-17 NOTE — Discharge Summary (Signed)
Raymond Hamilton, is a 46 y.o. male  DOB 1970/12/10  MRN [id number].  Admission date:  02/16/2017  Admitting Physician  Raymond Manisavid Willis, MD  Discharge Date:  02/17/2017   Primary MD  Raymond BihariWalker, Raymond B III, MD  Recommendations for primary care physician for things to follow:  With PCP in 1 week   Admission Diagnosis  Chest pain, unspecified type [R07.9]   Discharge Diagnosis  Chest pain, unspecified type [R07.9]    Principal Problem:   Chest pain Active Problems:   GERD (gastroesophageal reflux disease)      Past Medical History:  Diagnosis Date  . GERD (gastroesophageal reflux disease)   . HLD (hyperlipidemia)   . Neck pain     Past Surgical History:  Procedure Laterality Date  . TUMOR EXCISION         History of present illness and  Hospital Course:     Kindly see H&P for history of present illness and admission details, please review complete Labs, Consult reports and Test reports for all details in brief  HPI  from the history and physical done on the day of admission 46 year old male patient with history of neuropathy, GERD admitted for left-sided chest pain yesterday night.  Admitted to telemetry for chest pain observation.   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  I added a oxycodone, Skelaxin.  Will wait for echocardiogram if it is normal with normal ejection fraction without any wall motion abnormality, valve abnormality patient will be discharged home. 2.  History of neuropathy: Continue Neurontin 3.  History of GERD: Continue PPIs.      Discharge  Condition:stable   Follow UP  Follow-up Information    Elmo PuttWalker, Raymond B III, MD. Schedule an appointment as soon as possible for a visit in 1 week(s).   Specialty:  Internal Medicine Contact information: [address] [facility] [place] [id number] [phone]             Discharge Instructions  and  Discharge Medications      Allergies as of 02/17/2017   No Known Allergies     Medication List    TAKE these medications   gabapentin 100 MG capsule Commonly known as:  NEURONTIN TAKE 1 CAPSULE (100 MG TOTAL) BY MOUTH 2 (TWO) TIMES DAILY.   metaxalone 800 MG tablet Commonly known as:  SKELAXIN Take 1 tablet (800 mg total) 3 (three) times daily by mouth.   omeprazole 40 MG capsule Commonly known as:  PRILOSEC Take 1 capsule 2 (two) times daily by mouth.   oxyCODONE 5 MG immediate release tablet Commonly known as:  Oxy IR/ROXICODONE Take 1 tablet (5 mg total) every 4 (four) hours as needed by mouth for moderate pain.         Diet and Activity recommendation: See Discharge Instructions above   Consults obtained - cardio   Major procedures and Radiology Reports - PLEASE review detailed and final reports for all details, in brief -      Dg Chest 2 View  Result Date: 02/16/2017 CLINICAL DATA:  Chest pain radiating into the left side with shortness of breath and vomiting today. History of smoking and gastroesophageal reflux disease. EXAM: CHEST  2 VIEW COMPARISON:  Chest radiographs  05/02/2013 and 01/15/2013. FINDINGS: The heart size and mediastinal contours are normal. The lungs are clear. There is no pleural effusion or pneumothorax. No acute osseous findings are identified. IMPRESSION: Stable chest.  No active cardiopulmonary process. Electronically Signed   By: Raymond BullocksWilliam  Hamilton M.D.   On: 02/16/2017 20:23   Ct Angio Chest Aorta W And/or Wo Contrast  Result Date: 02/16/2017 CLINICAL DATA:  Acute onset of generalized chest pain,  shortness of breath and vomiting. EXAM: CT ANGIOGRAPHY CHEST WITH CONTRAST TECHNIQUE: Multidetector CT imaging of the chest was performed using the standard protocol during bolus administration of intravenous contrast. Multiplanar CT image reconstructions and MIPs were obtained to evaluate the vascular anatomy. CONTRAST:  100mL ISOVUE-370 IOPAMIDOL (ISOVUE-370) INJECTION 76% COMPARISON:  Chest radiograph performed earlier today at 8:10 p.m., and CT of the chest performed 06/19/2011 FINDINGS: Cardiovascular: There is no evidence of aortic dissection. There is no evidence of aneurysmal dilatation. There is no evidence of significant pulmonary embolus. The heart is normal in size. The thoracic aorta is grossly unremarkable. The great vessels are within normal limits. Mediastinum/Nodes: The mediastinum is unremarkable appearance. No mediastinal lymphadenopathy is seen. No pericardial effusion is identified. The visualized portions of the thyroid gland are unremarkable. No axillary lymphadenopathy is seen. There is mild nonspecific wall thickening at the distal esophagus. Lungs/Pleura: A small lymph node is noted along the superior aspect of the left major fissure. Minimal left basilar atelectasis is noted. No pleural effusion or pneumothorax is seen. No masses are identified. Upper Abdomen: The visualized portions of the liver and spleen are unremarkable. The visualized portions of the gallbladder, pancreas, adrenal glands and kidneys are within normal limits. Musculoskeletal: No acute osseous abnormalities are identified. The visualized musculature is unremarkable in appearance. Review of the MIP images confirms the above findings. IMPRESSION: 1. No evidence of aortic dissection. No evidence of aneurysmal dilatation. 2. No evidence of significant pulmonary embolus. 3. Minimal left basilar atelectasis.  Lungs otherwise clear. 4. Mild nonspecific wall thickening at the distal esophagus. This may reflect chronic  inflammation. Would correlate clinically for any evidence of esophagitis. Electronically Signed   By: Raymond RaiderJeffery  Hamilton M.D.   On: 02/16/2017 22:25    Micro Results     No results found for this or any previous visit (from the past 240 hour(s)).     Today   Subjective:   Raymond Hamilton today has no headache,no chest abdominal pain,no new weakness tingling or numbness, feels much better wants to go home today.   Objective:   Blood pressure 108/76, pulse 66, temperature 98.2 F (36.8 C), temperature source Oral, resp. rate 16, height 6\' 1"  (1.854 m), weight 85.1 kg (187 lb 11.2 oz), SpO2 95 %.   Intake/Output Summary (Last 24 hours) at 02/17/2017 1251 Last data filed at 02/17/2017 1221 Gross per 24 hour  Intake -  Output 800 ml  Net -800 ml    Exam Awake Alert, Oriented x 3, No new F.N deficits, Normal affect [place].AT,PERRAL Supple Neck,No JVD, No cervical lymphadenopathy appriciated.  Symmetrical Chest wall movement, Good air movement bilaterally, CTAB RRR,No Gallops,Rubs or new Murmurs, No Parasternal Heave +ve B.Sounds, Abd Soft, Non tender, No organomegaly appriciated, No rebound -guarding or rigidity. No Cyanosis, Clubbing or edema, No new Rash or bruise  Data Review   CBC w Diff:  Lab Results  Component Value Date   WBC 11.0 (H) 02/17/2017   HGB 15.5 02/17/2017   HGB 15.7 05/02/2013   HCT 45.7 02/17/2017   HCT  45.3 05/02/2013   PLT 69 (L) 02/17/2017   PLT 179 05/02/2013    CMP:  Lab Results  Component Value Date   NA 139 02/17/2017   NA 139 05/02/2013   K 3.8 02/17/2017   K 3.3 (L) 05/02/2013   CL 106 02/17/2017   CL 106 05/02/2013   CO2 26 02/17/2017   CO2 26 05/02/2013   BUN 7 02/17/2017   BUN 7 05/02/2013   CREATININE 0.80 02/17/2017   CREATININE 0.92 05/02/2013   PROT 7.0 01/15/2013   ALBUMIN 3.9 01/15/2013   BILITOT 0.6 01/15/2013   ALKPHOS 109 01/15/2013   AST 12 (L) 01/15/2013   ALT 22 01/15/2013  .   Total Time in preparing paper  work, data evaluation and todays exam - 35 minutes  Katha Hamming M.D on 02/17/2017 at 12:51 PM    Note: This dictation was prepared with Dragon dictation along with smaller phrase technology. Any transcriptional errors that result from this process are unintentional.

## 2017-02-17 NOTE — Progress Notes (Signed)
Patient is discharge home in a stable condition , summary and f/u care given to both pt and wife , verbalized understanding

## 2017-02-17 NOTE — Consult Note (Signed)
Cardiology Consultation:   Patient ID: Raymond Hamilton; [id number]; July 08, 1970   Admit date: 02/16/2017 Date of Consult: 02/17/2017  Primary Care Provider: Rafael Bihari, MD Primary Cardiologist: new to [facility] Physician requesting consult: Dr. Anne Hahn Reason for consult: Left-sided chest pain  Patient Profile:   Raymond Hamilton is a 46 y.o. male with a hx of smoking, GERD who presents to the hospital with left-sided chest pain   History of Present Illness:   Reports symptoms started approximately 2 days ago, seem to wax and wane then recurred yesterday 7 PM severe Describes pain 10/10 starting left of his sternum midway down radiating into his axilla He denies any radiation into his arm or jaw Pain was severe and he presented to the emergency room Reports it hurts to move, hurts to push on that location, hurts to take a deep breath  In the emergency room reports he was given nitroglycerin which improved his pain CT scan performed to rule out PE, this was normal, esophageal thickening, no coronary or aortic calcifications, no PE  Otherwise active at baseline, installs HVAC systems, 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 GERD (gastroesophageal reflux disease)   . HLD (hyperlipidemia)   . Neck pain     Past Surgical History:  Procedure Laterality Date  . TUMOR EXCISION       Home Medications:  Prior to Admission medications   Medication Sig Start Date End Date Taking? Authorizing Provider  gabapentin (NEURONTIN) 100 MG capsule TAKE 1 CAPSULE (100 MG TOTAL) BY MOUTH 2 (TWO) TIMES DAILY. 12/22/16  Yes [provider]  omeprazole (PRILOSEC) 40 MG capsule Take 1 capsule 2 (two) times daily by mouth. 10/01/16 10/01/17 Yes [provider]  metaxalone (SKELAXIN) 800 MG tablet Take 1 tablet (800 mg total) 3 (three) times daily by mouth. 02/17/17 02/17/18  Katha Hamming, MD  oxyCODONE (OXY IR/ROXICODONE) 5 MG immediate release  tablet Take 1 tablet (5 mg total) every 4 (four) hours as needed by mouth for moderate pain. 02/17/17   Katha Hamming, MD    Inpatient Medications: Scheduled Meds: . enoxaparin (LOVENOX) injection  40 mg Subcutaneous Q24H  . gabapentin  100 mg Oral BID  .  morphine injection  4 mg Intravenous Once  . ondansetron (ZOFRAN) IV  4 mg Intravenous Once  . pantoprazole  40 mg Oral BID   Continuous Infusions:  PRN Meds: acetaminophen **OR** acetaminophen, nitroGLYCERIN, ondansetron **OR** ondansetron (ZOFRAN) IV, oxyCODONE  Allergies:   No Known Allergies  Social History:   Social History   Socioeconomic History  . Marital status: Married    Spouse name: Not on file  . Number of children: Not on file  . Years of education: Not on file  . Highest education level: Not on file  Social Needs  . Financial resource strain: Not on file  . Food insecurity - worry: Not on file  . Food insecurity - inability: Not on file  . Transportation needs - medical: Not on file  . Transportation needs - non-medical: Not on file  Occupational History  . Not on file  Tobacco Use  . Smoking status: Current Every Day Smoker  . Smokeless tobacco: Never Used  Substance and Sexual Activity  . Alcohol use: Yes    Comment: occ  . Drug use: No  . Sexual activity: Not on file  Other Topics Concern  . Not on file  Social History Narrative  . Not on file  Family History:    Family History  Problem Relation Age of Onset  . Hypertension Mother   . Diabetes Father      ROS:  Please see the history of present illness.  Review of Systems  Constitution: Negative for diaphoresis, fever, weakness, malaise/fatigue and night sweats.  HENT: Negative.   Eyes: Negative.   Cardiovascular: Positive for chest pain. Negative for claudication, cyanosis, dyspnea on exertion, irregular heartbeat, leg swelling, near-syncope, orthopnea, palpitations and paroxysmal nocturnal dyspnea.  Respiratory: Negative  for cough, shortness of breath, sleep disturbances due to breathing and wheezing.   Endocrine: Negative.   Hematologic/Lymphatic: Negative.   Skin: Negative.   Musculoskeletal: Negative for falls, joint pain, joint swelling and myalgias.  Gastrointestinal: Positive for heartburn.  Neurological: Negative for difficulty with concentration, excessive daytime sleepiness, dizziness, focal weakness, light-headedness and numbness.  Psychiatric/Behavioral: Negative.     All other ROS reviewed and negative.     Physical Exam/Data:   Vitals:   02/16/17 2344 02/17/17 0324 02/17/17 0729 02/17/17 1303  BP: 133/82 110/69 108/76 126/73  Pulse: 76 68 66 94  Resp: 17 17 16    Temp: 97.9 F (36.6 C) 98 F (36.7 C) 98.2 F (36.8 C)   TempSrc: Oral Oral Oral   SpO2: 98% 99% 95%   Weight: 187 lb 11.2 oz (85.1 kg)     Height: 6\' 1"  (1.854 m)       Intake/Output Summary (Last 24 hours) at 02/17/2017 1428 Last data filed at 02/17/2017 1424 Gross per 24 hour  Intake 240 ml  Output 800 ml  Net -560 ml   Filed Weights   02/16/17 2004 02/16/17 2344  Weight: 197 lb (89.4 kg) 187 lb 11.2 oz (85.1 kg)   Body mass index is 24.76 kg/m.  General:  Well nourished, well developed, in no acute distress HEENT: normal Lymph: no adenopathy Neck: no JVD Endocrine:  No thryomegaly Vascular: No carotid bruits; FA pulses 2+ bilaterally without bruits  Cardiac:  normal S1, S2; RRR; no murmur  Lungs:  clear to auscultation bilaterally, no wheezing, rhonchi or rales  Abd: soft, nontender, no hepatomegaly  Ext: no edema Musculoskeletal:  No deformities, BUE and BLE strength normal and equal Skin: warm and dry  Neuro:  CNs 2-12 intact, no focal abnormalities noted Psych:  Normal affect   EKG:  The EKG was personally reviewed and demonstrates: Normal sinus rhythm with no significant ST or T wave changes Telemetry:  Telemetry was personally reviewed and demonstrates: Normal sinus rhythm  Relevant CV  Studies: Echocardiogram: Normal LV function, no wall motion abnormality  CT scan chest as detailed below  Laboratory Data:  Chemistry Recent Labs  Lab 02/16/17 2020 02/17/17 0624  NA 137 139  K 3.3* 3.8  CL 103 106  CO2 22 26  GLUCOSE 136* 105*  BUN 6 7  CREATININE 0.84 0.80  CALCIUM 9.7 9.0  GFRNONAA >60 >60  GFRAA >60 >60  ANIONGAP 12 7    No results for input(s): PROT, ALBUMIN, AST, ALT, ALKPHOS, BILITOT in the last 168 hours. Hematology Recent Labs  Lab 02/16/17 2020 02/17/17 0624  WBC 11.6* 11.0*  RBC 5.23 4.83  HGB 16.6 15.5  HCT 49.2 45.7  MCV 94.1 94.6  MCH 31.7 32.2  MCHC 33.7 34.0  RDW 13.7 13.3  PLT PLATELET CLUMPS NOTED ON SMEAR, UNABLE TO ESTIMATE 69*   Cardiac Enzymes Recent Labs  Lab 02/16/17 2020 02/17/17 0005 02/17/17 0624  TROPONINI <0.03 <0.03 <0.03   No results  for input(s): TROPIPOC in the last 168 hours.  BNPNo results for input(s): BNP, PROBNP in the last 168 hours.  DDimer No results for input(s): DDIMER in the last 168 hours.  Radiology/Studies:  Dg Chest 2 View  Result Date: 02/16/2017 CLINICAL DATA:  Chest pain radiating into the left side with shortness of breath and vomiting today. History of smoking and gastroesophageal reflux disease. EXAM: CHEST  2 VIEW COMPARISON:  Chest radiographs 05/02/2013 and 01/15/2013. FINDINGS: The heart size and mediastinal contours are normal. The lungs are clear. There is no pleural effusion or pneumothorax. No acute osseous findings are identified. IMPRESSION: Stable chest.  No active cardiopulmonary process. Electronically Signed   By: Carey Bullocks M.D.   On: 02/16/2017 20:23   Ct Angio Chest Aorta W And/or Wo Contrast  Result Date: 02/16/2017 CLINICAL DATA:  Acute onset of generalized chest pain, shortness of breath and vomiting. EXAM: CT ANGIOGRAPHY CHEST WITH CONTRAST TECHNIQUE: Multidetector CT imaging of the chest was performed using the standard protocol during bolus administration of  intravenous contrast. Multiplanar CT image reconstructions and MIPs were obtained to evaluate the vascular anatomy. CONTRAST:  ISOVUE-370 IOPAMIDOL (ISOVUE-370) INJECTION 76% COMPARISON:  Chest radiograph performed earlier today at 8:10 p.m., and CT of the chest performed 06/19/2011 FINDINGS: Cardiovascular: There is no evidence of aortic dissection. There is no evidence of aneurysmal dilatation. There is no evidence of significant pulmonary embolus. The heart is normal in size. The thoracic aorta is grossly unremarkable. The great vessels are within normal limits. Mediastinum/Nodes: The mediastinum is unremarkable appearance. No mediastinal lymphadenopathy is seen. No pericardial effusion is identified. The visualized portions of the thyroid gland are unremarkable. No axillary lymphadenopathy is seen. There is mild nonspecific wall thickening at the distal esophagus. Lungs/Pleura: A small lymph node is noted along the superior aspect of the left major fissure. Minimal left basilar atelectasis is noted. No pleural effusion or pneumothorax is seen. No masses are identified. Upper Abdomen: The visualized portions of the liver and spleen are unremarkable. The visualized portions of the gallbladder, pancreas, adrenal glands and kidneys are within normal limits. Musculoskeletal: No acute osseous abnormalities are identified. The visualized musculature is unremarkable in appearance. Review of the MIP images confirms the above findings. IMPRESSION: 1. No evidence of aortic dissection. No evidence of aneurysmal dilatation. 2. No evidence of significant pulmonary embolus. 3. Minimal left basilar atelectasis.  Lungs otherwise clear. 4. Mild nonspecific wall thickening at the distal esophagus. This may reflect chronic inflammation. Would correlate clinically for any evidence of esophagitis. Electronically Signed   By: Roanna Raider M.D.   On: 02/16/2017 22:25    Assessment and Plan:   1. Chest pain Initially felt  to be cardiac and was given nitroglycerin in the emergency room CT scan negative Review of images by myself showing no coronary calcifications, minimal if any aortic atherosclerosis -Physical exam consistent with musculoskeletal disorder, significant point tenderness on pushing on his left pectoral region. Likely has a "rib out" Given his job which involves heavy lifting, likely strained on the job -Recommended icing, rest, slow range of motion exercises, muscle relaxer medication, pain pill, anti-inflammatories -f no relief after several days consider chiropractic therapy Recommended he try to avoid work for several days until symptoms have dramatically improved No further cardiac workup needed  2) smoker Smoked for 26 years Smoking cessation discussed with him We have encouraged him to continue to work on weaning his cigarettes and smoking cessation. He will continue to work on this  and does not want any assistance with chantix.   3) GERD CT scan concerning for esophageal thickening distal esophagus Recommended he talk with primary care, may need more aggressive GERD medication Reports having EGD in the past  Long discussion with patient and his wife at the bedside concerning presentation, cardiac workup Musculoskeletal disorder Case discussed with hospitalist service and echocardiogram technician  Total encounter time more than 110 minutes  Greater than 50% was spent in counseling and coordination of care with the patient  For questions or updates, please contact [facility] Please consult [email or web address] for contact info under Cardiology/STEMI.   Signed, Julien Nordmannimothy Saidee Geremia, MD  02/17/2017 2:28 PM

## 2017-02-18 LAB — HIV ANTIBODY (ROUTINE TESTING W REFLEX): HIV Screen 4th Generation wRfx: NONREACTIVE

## 2017-02-21 ENCOUNTER — Telehealth: Payer: Self-pay | Admitting: Cardiovascular Disease

## 2017-02-21 NOTE — Telephone Encounter (Signed)
Despina PoleGilley, Sabrina F  P Cv [facility] Triage        11/27 10:40  Dr. Mariah MillingGollan

## 2017-02-21 NOTE — Telephone Encounter (Signed)
Patient came by to drop off Request for Leave of Absence to be completed [facility]

## 2017-02-22 NOTE — Telephone Encounter (Signed)
Patient contacted regarding [facility] on [facility].  Patient understands to follow up with provider Dr. Mariah MillingGollan on 03/01/17 at 10[facility]40[facility]. Patient understands discharge instructions? Yes Patient understands medications and regiment? Yes Patient understands to bring all medications to this visit? Yes  Patient verbalized understanding of all information with no further questions at this time.

## 2017-03-01 ENCOUNTER — Encounter: Payer: Self-pay | Admitting: *Deleted

## 2017-03-01 ENCOUNTER — Ambulatory Visit (INDEPENDENT_AMBULATORY_CARE_PROVIDER_SITE_OTHER): Payer: BC Managed Care – PPO | Admitting: Cardiovascular Disease

## 2017-03-01 ENCOUNTER — Encounter: Payer: Self-pay | Admitting: Cardiovascular Disease

## 2017-03-01 VITALS — BP 137/82 | HR 76 | Ht 73.0 in | Wt 194.8 lb

## 2017-03-01 DIAGNOSIS — R0782 Intercostal pain: Secondary | ICD-10-CM

## 2017-03-01 DIAGNOSIS — F17200 Nicotine dependence, unspecified, uncomplicated: Secondary | ICD-10-CM

## 2017-03-01 DIAGNOSIS — K2100 Gastro-esophageal reflux disease with esophagitis, without bleeding: Secondary | ICD-10-CM

## 2017-03-01 NOTE — Patient Instructions (Signed)
Medication Instructions:   No medication changes made  Try the aleve/naproxen 1-2 pills twice a day  Labwork:  No new labs needed  Testing/Procedures:  No further testing at this time   Follow-Up: It was a pleasure seeing you in the office today. Please call us if you have new issues that need to be addressed before your next appt.  [phone]  Your physician wants you to follow-up in:  Call the office when you are ready for regular duty  If you need a refill on your cardiac medications before your next appointment, please call your pharmacy.

## 2017-03-01 NOTE — Progress Notes (Signed)
Cardiology Office Note  Date:  03/01/2017   ID:  Raymond Hamilton, DOB [birth year], MRN [id number]  PCP:  Rafael BihariWalker, [name], MD   Chief Complaint  Patient presents with  . Other    Ed follow up for chest pain. Patient denies chest pain and SOB at this time. Meds reviewed verbally with paitent.     HPI:  Raymond Hamilton is a 46 y.o. 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  PSH:    Past Surgical History:  Procedure Laterality Date  . TUMOR EXCISION      Current Outpatient Medications  Medication Sig Dispense Refill  . gabapentin (NEURONTIN) 100 MG capsule TAKE 1 CAPSULE (100 MG TOTAL) BY MOUTH 2 (TWO) TIMES DAILY.  11  . metaxalone (SKELAXIN) 800 MG tablet Take 1 tablet (800 mg total) 3 (three) times daily by mouth. 90 tablet 0  . omeprazole (PRILOSEC) 40 MG capsule Take 1 capsule 2 (two) times daily by mouth.    . oxyCODONE (OXY IR/ROXICODONE) 5 MG immediate release tablet Take 1 tablet (5 mg total)  every 4 (four) hours as needed by mouth for moderate pain. 30 tablet 0   No current facility-administered medications for this visit.      Allergies:   Patient has no known allergies.   Social History:  The patient  reports that he has been smoking.  he has never used smokeless tobacco. He reports that he drinks alcohol. He reports that he does not use drugs.   Family History:   family history includes Diabetes in his father; Hypertension in his mother.    Review of Systems: Review of Systems  Constitutional: Negative.   Respiratory: Negative.   Cardiovascular: Negative.        Reproducible left-sided chest pain on palpation  Gastrointestinal: Negative.   Musculoskeletal: Negative.        Point specific back pain upper thoracic spine to the left  Neurological: Negative.   Psychiatric/Behavioral: Negative.   All other systems reviewed and are negative.    PHYSICAL EXAM: VS:  BP 137/82 (BP Location: Left Arm, Patient Position: Sitting, Cuff Size: Normal)   Pulse 76   Ht 6\' 1"  (1.854 m)   Wt 194 lb 12 oz (88.3 kg)   BMI 25.69 kg/m  , BMI Body mass index is 25.69 kg/m. GEN: Well nourished, well developed, in no acute distress  HEENT: normal  Neck: no JVD, carotid bruits, or masses Cardiac:  RRR; no murmurs, rubs, or gallops,no edema  Left chest pain on palpation  Respiratory:  clear to auscultation bilaterally, normal work of breathing GI: soft, nontender, nondistended, + BS MS: no deformity or atrophy .  He is having back pain upper thoracic on the left, discomfort on palpation of his spine Skin: warm and dry, no rash Neuro:  Strength and sensation are intact Psych: euthymic mood, full affect    Recent Labs: 02/17/2017: BUN 7; Creatinine, Ser 0.80; Hemoglobin 15.5; Platelets 69; Potassium 3.8; Sodium 139    Lipid Panel No results found for: CHOL, HDL, LDLCALC, TRIG    Wt Readings from Last 3 Encounters:  03/01/17 194 lb 12 oz (88.3 kg)  02/16/17 187 lb 11.2 oz  (85.1 kg)       ASSESSMENT AND PLAN:  Intercostal pain - Plan: EKG 12-Lead No significant relief Physical exam consistent with musculoskeletal disorder,  significant point tenderness on pushing on his left pectoral region. Likely has a "rib out" Recommended he see chiropractic Continue icing, anti-inflammatory medication Would not recommend he go back to work until symptoms resolved  2)Gastroesophageal reflux disease with esophagitis - Plan: EKG 12-Lead CT scan concerning for esophageal thickening distal esophagus will likely need high-dose Recommended that he continue on his PPI, given concern for esophagitis He will start taking NSAIDs for chest pain  3) smoker Smoked for 26 years Smoking cessation discussed with him We have encouraged him to continue to work on weaning his cigarettes and smoking cessation. He will continue to work on this and does not want any assistance with chantix.   Disposition:   F/U  As needed   Orders Placed This Encounter  Procedures  . EKG 12-Lead     Signed, Dossie Arbourim Gwendolin Briel, M.D., Ph.D. 03/01/2017  [facility], [place] [phone]

## 2017-06-09 ENCOUNTER — Ambulatory Visit
Admission: RE | Admit: 2017-06-09 | Discharge: 2017-06-09 | Disposition: A | Discharge status: Home / Self Care | Payer: BC Managed Care – PPO | Source: Ambulatory Visit | Attending: Physician Assistant | Admitting: Physician Assistant

## 2017-06-09 ENCOUNTER — Other Ambulatory Visit: Payer: Self-pay | Admitting: Physician Assistant

## 2017-06-09 DIAGNOSIS — R519 Headache, unspecified: Secondary | ICD-10-CM

## 2017-06-09 DIAGNOSIS — R9089 Other abnormal findings on diagnostic imaging of central nervous system: Secondary | ICD-10-CM | POA: Diagnosis not present

## 2017-06-09 DIAGNOSIS — R51 Headache: Principal | ICD-10-CM

## 2017-06-09 DIAGNOSIS — Z9889 Other specified postprocedural states: Secondary | ICD-10-CM | POA: Insufficient documentation

## 2018-05-07 ENCOUNTER — Encounter: Payer: Self-pay | Admitting: Intensive Care

## 2018-05-07 ENCOUNTER — Emergency Department: Payer: BC Managed Care – PPO

## 2018-05-07 ENCOUNTER — Other Ambulatory Visit: Payer: Self-pay

## 2018-05-07 ENCOUNTER — Emergency Department
Admission: EM | Admit: 2018-05-07 | Discharge: 2018-05-07 | Disposition: A | Discharge status: Home / Self Care | Payer: BC Managed Care – PPO | Attending: Emergency Medicine | Admitting: Emergency Medicine

## 2018-05-07 DIAGNOSIS — F17210 Nicotine dependence, cigarettes, uncomplicated: Secondary | ICD-10-CM | POA: Insufficient documentation

## 2018-05-07 DIAGNOSIS — Z79899 Other long term (current) drug therapy: Secondary | ICD-10-CM | POA: Diagnosis not present

## 2018-05-07 DIAGNOSIS — R0789 Other chest pain: Secondary | ICD-10-CM | POA: Insufficient documentation

## 2018-05-07 DIAGNOSIS — R079 Chest pain, unspecified: Secondary | ICD-10-CM | POA: Diagnosis present

## 2018-05-07 LAB — CBC
HEMATOCRIT: 47.5 % (ref 39.0–52.0)
Hemoglobin: 16.5 g/dL (ref 13.0–17.0)
MCH: 32.7 pg (ref 26.0–34.0)
MCHC: 34.7 g/dL (ref 30.0–36.0)
MCV: 94.2 fL (ref 80.0–100.0)
NRBC: 0 % (ref 0.0–0.2)
Platelets: 178 10*3/uL (ref 150–400)
RBC: 5.04 MIL/uL (ref 4.22–5.81)
RDW: 13 % (ref 11.5–15.5)
WBC: 8 10*3/uL (ref 4.0–10.5)

## 2018-05-07 LAB — BASIC METABOLIC PANEL
ANION GAP: 8 (ref 5–15)
BUN: 6 mg/dL (ref 6–20)
CO2: 26 mmol/L (ref 22–32)
CREATININE: 0.93 mg/dL (ref 0.61–1.24)
Calcium: 9.5 mg/dL (ref 8.9–10.3)
Chloride: 103 mmol/L (ref 98–111)
GFR calc non Af Amer: >60 mL/min (ref >60)
Glucose, Bld: 104 mg/dL — ABNORMAL HIGH (ref 70–99)
POTASSIUM: 4 mmol/L (ref 3.5–5.1)
SODIUM: 137 mmol/L (ref 135–145)

## 2018-05-07 LAB — TROPONIN I

## 2018-05-07 MED ORDER — IOHEXOL 300 MG/ML  SOLN
75.0000 mL | Freq: Once | INTRAMUSCULAR | Status: AC | PRN
  Administered 2018-05-07: 75 mL via INTRAVENOUS

## 2018-05-07 NOTE — ED Triage Notes (Signed)
Patient has procedure on Tuesday on neck to have cyst removed. This AM upon awakening has been having neck pain that radiates into L chest with SOB.

## 2018-05-07 NOTE — Discharge Instructions (Addendum)
Return to the ER for new, worsening, or persistent severe pain, difficulty breathing, weakness or lightheadedness, or any other new or worsening symptoms that concern you.  Follow-up with your surgeon as instructed and with your primary care doctor within the next few weeks.

## 2018-05-07 NOTE — ED Provider Notes (Signed)
[facility] Emergency Department Provider Note ____________________________________________   First MD Initiated Contact with Patient 05/07/18 1357     (approximate)  I have reviewed the triage vital signs and the nursing notes.   HISTORY  Chief Complaint Chest Pain    HPI Raymond Hamilton is a 48 y.o. male with PMH as noted below who presents with left-sided neck and chest pain, acute onset when he awoke this morning around 8 AM, and constant since then.  It is worse when he takes a deep breath but not associated with shortness of breath.  He denies associated nausea or lightheadedness.  The patient had surgery to his neck just right of the midline several days ago to remove a cyst.  He denies any difficulty swallowing.  There is no swelling but it is sore when he touches the left side of his neck.  Past Medical History:  Diagnosis Date  . GERD (gastroesophageal reflux disease)   . HLD (hyperlipidemia)   . Neck pain     Patient Active Problem List   Diagnosis Date Noted  . Smoker 03/01/2017  . Chest pain 02/16/2017  . GERD (gastroesophageal reflux disease) 02/16/2017    Past Surgical History:  Procedure Laterality Date  . TUMOR EXCISION      Prior to Admission medications   Medication Sig Start Date End Date Taking? Authorizing Provider  gabapentin (NEURONTIN) 100 MG capsule TAKE 1 CAPSULE (100 MG TOTAL) BY MOUTH 2 (TWO) TIMES DAILY. 12/22/16   [provider]  omeprazole (PRILOSEC) 40 MG capsule Take 1 capsule 2 (two) times daily by mouth. 10/01/16 10/01/17  [provider]  oxyCODONE (OXY IR/ROXICODONE) 5 MG immediate release tablet Take 1 tablet (5 mg total) every 4 (four) hours as needed by mouth for moderate pain. 02/17/17   Katha Hamming, MD    Allergies Patient has no known allergies.  Family History  Problem Relation Age of Onset  . Hypertension Mother   . Diabetes Father     Social History Social History    Tobacco Use  . Smoking status: Current Every Day Smoker    Types: Cigarettes  . Smokeless tobacco: Never Used  Substance Use Topics  . Alcohol use: Yes    Comment: occ  . Drug use: No    Review of Systems  Constitutional: No fever. Eyes: No redness. ENT: Positive for neck pain.  No difficulty swallowing. Cardiovascular: Positive for chest pain. Respiratory: Denies shortness of breath. Gastrointestinal: No vomiting or diarrhea.  Genitourinary: Negative for flank pain.  Musculoskeletal: Negative for back pain. Skin: Negative for rash. Neurological: Negative for headache.   ____________________________________________   PHYSICAL EXAM:  VITAL SIGNS: ED Triage Vitals  Enc Vitals Group     BP 05/07/18 1143 (!) 143/93     Pulse Rate 05/07/18 1143 90     Resp 05/07/18 1143 16     Temp 05/07/18 1143 98.1 F (36.7 C)     Temp Source 05/07/18 1143 Oral     SpO2 05/07/18 1143 97 %     Weight 05/07/18 1144 190 lb (86.2 kg)     Height 05/07/18 1144 6\' 1"  (1.854 m)     Head Circumference --      Peak Flow --      Pain Score 05/07/18 1143 8     Pain Loc --      Pain Edu? --      Excl. in GC? --     Constitutional: Alert and  oriented. Well appearing and in no acute distress. Eyes: Conjunctivae are normal.  Head: Atraumatic. Nose: No congestion/rhinnorhea. Mouth/Throat: Mucous membranes are moist.   Neck: Normal range of motion.  Small surgical scar to the anterior inferior neck just to the right of midline with no swelling.  Mild tenderness to the left side of the neck reproducing the pain.  No swelling or palpable masses. Cardiovascular: Normal rate, regular rhythm. Grossly normal heart sounds.  Good peripheral circulation. Respiratory: Normal respiratory effort.  No retractions. Lungs CTAB. Gastrointestinal: No distention.  Musculoskeletal: Extremities warm and well perfused.  Neurologic:  Normal speech and language. No gross focal neurologic deficits are appreciated.   Skin:  Skin is warm and dry. No rash noted. Psychiatric: Mood and affect are normal. Speech and behavior are normal.  ____________________________________________   LABS (all labs ordered are listed, but only abnormal results are displayed)  Labs Reviewed  BASIC METABOLIC PANEL - Abnormal; Notable for the following components:      Result Value   Glucose, Bld 104 (*)    All other components within normal limits  CBC  TROPONIN I   ____________________________________________  EKG  ED ECG REPORT I, Dionne BucySebastian Dennis Killilea, the attending physician, personally viewed and interpreted this ECG.  Date: 05/07/2018 EKG Time: 1146 Rate: 83 Rhythm: normal sinus rhythm QRS Axis: normal Intervals: normal ST/T Wave abnormalities: normal Narrative Interpretation: no evidence of acute ischemia  ____________________________________________  RADIOLOGY  CXR: No focal infiltrate or other acute abnormality CT neck soft tissue: Normal postsurgical changes to the right neck with no acute findings in the left side of the neck.  ____________________________________________   PROCEDURES  Procedure(s) performed: No  Procedures  Critical Care performed: No ____________________________________________   INITIAL IMPRESSION / ASSESSMENT AND PLAN / ED COURSE  Pertinent labs & imaging results that were available during my care of the patient were reviewed by me and considered in my medical decision making (see chart for details).  48 year old male with PMH as noted above presents with left-sided neck and chest pain acute onset when he awoke this morning.  I reviewed the past medical records in Epic.  The patient had removal of an epidermal inclusion cyst on the right side (the other side) of his neck on 05/02/2018 without complication  On exam the patient is well-appearing and his vital signs are normal.  He does have reproducible pain to the left side of his neck away from the surgical site  but his range of motion is normal.  The remainder of the exam is unremarkable.  EKG is normal and chest x-ray shows no acute abnormalities.  Initial labs obtained from triage are also within normal limits.  Since the troponin was obtained around 4 hours from when the pain started, there is no indication for a repeat.  Overall I suspect most likely musculoskeletal pain or some type of inflammation related to his recent surgery.  However given the reproducible tenderness on exam I will obtain a CT to further evaluate.  Given the atypical nature of the pain and the lack of tachycardia or hypoxia, there is no evidence of ACS, PE, or aortic dissection or other vascular cause.  The patient has no risk factors for these etiologies.  ----------------------------------------- 4:46 PM on 05/07/2018 -----------------------------------------  CT is negative.  The patient is comfortable and has not wanted any pain medications.  He feels well to go home.  At this time given the negative work-up he is stable for discharge.  I counseled him  on the results.  I suspect most likely benign etiology related to his recent surgery such as referred pain.  Return precautions given, and he expresses understanding.  ____________________________________________   FINAL CLINICAL IMPRESSION(S) / ED DIAGNOSES  Final diagnoses:  Atypical chest pain      NEW MEDICATIONS STARTED DURING THIS VISIT:  New Prescriptions   No medications on file     Note:  This document was prepared using Dragon voice recognition software and may include unintentional dictation errors.    Dionne Bucy, MD 05/07/18 [phone]

## 2018-05-08 ENCOUNTER — Other Ambulatory Visit: Payer: Self-pay

## 2018-05-08 ENCOUNTER — Emergency Department
Admission: EM | Admit: 2018-05-08 | Discharge: 2018-05-08 | Disposition: A | Discharge status: Home / Self Care | Payer: BC Managed Care – PPO | Attending: Student in an Organized Health Care Education/Training Program | Admitting: Student in an Organized Health Care Education/Training Program

## 2018-05-08 DIAGNOSIS — J029 Acute pharyngitis, unspecified: Secondary | ICD-10-CM | POA: Diagnosis not present

## 2018-05-08 DIAGNOSIS — F17210 Nicotine dependence, cigarettes, uncomplicated: Secondary | ICD-10-CM | POA: Diagnosis not present

## 2018-05-08 DIAGNOSIS — R070 Pain in throat: Secondary | ICD-10-CM | POA: Diagnosis present

## 2018-05-08 DIAGNOSIS — Z79899 Other long term (current) drug therapy: Secondary | ICD-10-CM | POA: Diagnosis not present

## 2018-05-08 MED ORDER — PREDNISONE 20 MG PO TABS: 40.0000 mg | ORAL_TABLET | Freq: Every day | ORAL | 0 refills | Status: AC

## 2018-05-08 MED ORDER — HYDROCODONE-ACETAMINOPHEN 5-325 MG PO TABS: 1.0000 | ORAL_TABLET | ORAL | 0 refills | Status: DC | PRN

## 2018-05-08 MED ORDER — IBUPROFEN 800 MG PO TABS
800.0000 mg | ORAL_TABLET | Freq: Once | ORAL | Status: AC
  Administered 2018-05-08: 800 mg via ORAL
  Filled 2018-05-08: qty 1

## 2018-05-08 NOTE — ED Notes (Addendum)
Pt c/o pain at neck approx 2-3 in below cricoid, Pt reports surgery at site on Tuesday by Dr Trisha Mangle to remove a cyst that had been "growing for the last two years", was in ED yesterday for same, and last IBU 400mg  at 2200,   Pt reports no difficulty swallowing but pain on palpation in inhalation  Tape from surgery removed, wound appears WNL color, well approximated  No occlusion to airway or stridor auscultated, lungs clear, no apparent distress

## 2018-05-08 NOTE — ED Provider Notes (Signed)
[facility] Emergency Department Provider Note    First MD Initiated Contact with Patient 05/08/18 [phone]     (approximate)  I have reviewed the triage vital signs and the nursing notes.   HISTORY  Chief Complaint Sore Throat    HPI Lanny Crampimmy W Greggory StallionGeorge is a 48 y.o. male presents the ER for evaluation of discomfort in his throat feeling as though something is squeezing his airway.  States that this started again last night.  Is not having any difficulty breathing right now.  Was seen in the ER yesterday with extensive work-up including CT imaging of the neck.  There was evidence of small postoperative edema and inflammation but no evidence of abscess, hematoma or mass-effect.  Does describe some discomfort and pain related to the surgical site.    Past Medical History:  Diagnosis Date  . GERD (gastroesophageal reflux disease)   . HLD (hyperlipidemia)   . Neck pain    Family History  Problem Relation Age of Onset  . Hypertension Mother   . Diabetes Father    Past Surgical History:  Procedure Laterality Date  . TUMOR EXCISION     Patient Active Problem List   Diagnosis Date Noted  . Smoker 03/01/2017  . Chest pain 02/16/2017  . GERD (gastroesophageal reflux disease) 02/16/2017      Prior to Admission medications   Medication Sig Start Date End Date Taking? Authorizing Provider  gabapentin (NEURONTIN) 100 MG capsule TAKE 1 CAPSULE (100 MG TOTAL) BY MOUTH 2 (TWO) TIMES DAILY. 12/22/16   [provider]  omeprazole (PRILOSEC) 40 MG capsule Take 1 capsule 2 (two) times daily by mouth. 10/01/16 10/01/17  [provider]    Allergies Patient has no known allergies.    Social History Social History   Tobacco Use  . Smoking status: Current Every Day Smoker    Types: Cigarettes  . Smokeless tobacco: Never Used  Substance Use Topics  . Alcohol use: Yes    Comment: occ  . Drug use: No    Review of Systems Patient denies  headaches, rhinorrhea, blurry vision, numbness, shortness of breath, chest pain, edema, cough, abdominal pain, nausea, vomiting, diarrhea, dysuria, fevers, rashes or hallucinations unless otherwise stated above in HPI. ____________________________________________   PHYSICAL EXAM:  VITAL SIGNS: Vitals:   05/08/18 0217 05/08/18 0625  BP: 130/90 (!) 142/90  Pulse: 87 70  Resp: 17 18  Temp: 98 F (36.7 C)   SpO2: 99% 98%    Constitutional: Alert and oriented.  Eyes: Conjunctivae are normal.  Head: Atraumatic. Nose: No congestion/rhinnorhea. Mouth/Throat: Mucous membranes are moist.   Neck: No stridor. Painless ROM.  Small 1 cm surgical incision site is clean dry and intact without any signs of surrounding cellulitis.  No underlying fluctuance.  Does have generalized mild tenderness of the neck no crepitus.  No bruits. Cardiovascular: Normal rate, regular rhythm. Grossly normal heart sounds.  Good peripheral circulation. Respiratory: Normal respiratory effort.  No retractions. Lungs CTAB. Gastrointestinal: Soft and nontender. No distention. No abdominal bruits. No CVA tenderness. Genitourinary:  Musculoskeletal: No lower extremity tenderness nor edema.  No joint effusions. Neurologic:  Normal speech and language. No gross focal neurologic deficits are appreciated. No facial droop Skin:  Skin is warm, dry and intact. No rash noted. Psychiatric: Mood and affect are normal. Speech and behavior are normal.  ____________________________________________   LABS (all labs ordered are listed, but only abnormal results are displayed)  Results for orders placed or performed during  the hospital encounter of 05/07/18 (from the past 24 hour(s))  Basic metabolic panel     Status: Abnormal   Collection Time: 05/07/18 11:54 AM  Result Value Ref Range   Sodium 137 135 - 145 mmol/L   Potassium 4.0 3.5 - 5.1 mmol/L   Chloride 103 98 - 111 mmol/L   CO2 26 22 - 32 mmol/L   Glucose, Bld 104 (H) 70 -  99 mg/dL   BUN 6 6 - 20 mg/dL   Creatinine, Ser 9.16 0.61 - 1.24 mg/dL   Calcium 9.5 8.9 - 38.4 mg/dL   GFR calc non Af Amer >60 >60 mL/min   GFR calc Af Amer >60 >60 mL/min   Anion gap 8 5 - 15  CBC     Status: None   Collection Time: 05/07/18 11:54 AM  Result Value Ref Range   WBC 8.0 4.0 - 10.5 K/uL   RBC 5.04 4.22 - 5.81 MIL/uL   Hemoglobin 16.5 13.0 - 17.0 g/dL   HCT 66.5 99.3 - 57.0 %   MCV 94.2 80.0 - 100.0 fL   MCH 32.7 26.0 - 34.0 pg   MCHC 34.7 30.0 - 36.0 g/dL   RDW 17.7 93.9 - 03.0 %   Platelets 178 150 - 400 K/uL   nRBC 0.0 0.0 - 0.2 %  Troponin I - ONCE - STAT     Status: None   Collection Time: 05/07/18 11:54 AM  Result Value Ref Range   Troponin I <0.03 <0.03 ng/mL   ____________________________________________ ____________________________________________   PROCEDURES  Procedure(s) performed:  Procedures    Critical Care performed: no ____________________________________________   INITIAL IMPRESSION / ASSESSMENT AND PLAN / ED COURSE  Pertinent labs & imaging results that were available during my care of the patient were reviewed by me and considered in my medical decision making (see chart for details).   DDX: Mass-effect, infection, cellulitis, referred pain  Keijuan Iwanicki Karriker is a 48 y.o. who presents to the ED with symptoms as described above.  Patient well-appearing and hemodynamically stable.  Exam is reassuring.  Is no stridor or evidence of airway impingement or airway compromise.  Given his extensive work-up within the past 24 hours I do not feel that repeat imaging, blood work or other diagnostic testing clinically indicated.  Seems to be warranted symptomatic management needed at this point.  His lungs are clear.  No signs of asthma.  Certainly no evidence of allergic reaction.  He was not under general anesthesia as evidence of cord edema.  CT scan did show some mild postoperative edema will start him on a low-dose steroid for 3 to 4 days with  some pain medication with referral to surgery.  I personally reviewed all radiographic images ordered to evaluate for the above acute complaints and reviewed radiology reports and findings.  These findings were personally discussed with the patient.  Please see medical record for radiology report.       As part of my medical decision making, I reviewed the following data within the electronic MEDICAL RECORD NUMBER Nursing notes reviewed and incorporated, Labs reviewed, notes from prior ED visits and [place] Controlled Substance Database   ____________________________________________   FINAL CLINICAL IMPRESSION(S) / ED DIAGNOSES  Final diagnoses:  Sore throat      NEW MEDICATIONS STARTED DURING THIS VISIT:  Current Discharge Medication List       Note:  This document was prepared using Dragon voice recognition software and may include unintentional dictation errors.  Willy Eddyobinson, Oyinkansola Truax, MD 05/08/18 [phone]

## 2018-05-08 NOTE — ED Triage Notes (Signed)
Discharged this afternoon around 1700 with sore throat discharge instructions. Instructed to take Tylenol or ibuprofen for pain. Was told to return if he wasn't feeling any better. He returns because he isn't feeling any better.

## 2018-05-08 NOTE — ED Notes (Signed)
Of note, patient is speaking clearly without difficulty. Is able to point to the area of discomfort and he points to mid throat. States it feels like something is poking him.

## 2018-05-15 ENCOUNTER — Encounter: Payer: Self-pay | Admitting: *Deleted

## 2018-07-04 ENCOUNTER — Ambulatory Visit: Payer: BC Managed Care – PPO | Admitting: Gastroenterology

## 2018-07-05 ENCOUNTER — Ambulatory Visit (INDEPENDENT_AMBULATORY_CARE_PROVIDER_SITE_OTHER): Payer: BC Managed Care – PPO | Admitting: Gastroenterology

## 2018-07-05 ENCOUNTER — Encounter: Payer: Self-pay | Admitting: Gastroenterology

## 2018-07-05 ENCOUNTER — Other Ambulatory Visit: Payer: Self-pay

## 2018-07-05 DIAGNOSIS — R1310 Dysphagia, unspecified: Secondary | ICD-10-CM

## 2018-07-05 NOTE — Progress Notes (Signed)
Midge Minium, MD [address]  [address]  [place], [place] [address]  Main: [phone]  Fax: [phone]   Gastroenterology Consultation  Referring Provider:     Linus Salmons, MD Primary Care Physician:  Gracelyn Nurse, MD Reason for Consultation:     Dysphagia        HPI:   Virtual Visit via Telephone Note  I connected with patient on 07/05/18 at  9:50 AM EDT by telephone and verified that I am speaking with the correct person using two identifiers.   I discussed the limitations, risks, security and privacy concerns of performing an evaluation and management service by telephone and the availability of in person appointments. I also discussed with the patient that there may be a patient responsible charge related to this service. The patient expressed understanding and agreed to proceed.  Location of the patient: Home Location of provider: Home Participating persons: Patient, Raymond Hamilton and myself.   History of Present Illness: Raymond Hamilton is a 48 y.o. y/o male referred for consultation & management  by Dr. Letitia Libra, Beulah Gandy, MD.  The patient takes Omeprazole 80mg  BID.  The patient reports that he has had problems swallowing for many years.  He states that it has gotten worse recently.  When he does have food that does not go down he goes to the bathroom and vomits it up.  The patient denies any alcohol abuse but he does report smoking.  The patient denies any family history of any GI malignancies.  He does report that he had an upper endoscopy in 2012 but does not recall if it had any dilation at that time.  He had the EGD at that time for the same problems.  It was done by Dr. Marva Panda.  There is no report of any black stools or bloody stools.  He also denies any breakthrough heartburn on his omeprazole 80 mg twice a day.  Past Medical History:  Diagnosis Date  . GERD (gastroesophageal reflux disease)   . HLD (hyperlipidemia)   . Neck pain     Past  Surgical History:  Procedure Laterality Date  . TUMOR EXCISION      Prior to Admission medications   Medication Sig Start Date End Date Taking? Authorizing Provider  gabapentin (NEURONTIN) 100 MG capsule TAKE 1 CAPSULE (100 MG TOTAL) BY MOUTH 2 (TWO) TIMES DAILY. 12/22/16   [provider]  HYDROcodone-acetaminophen (NORCO) 5-325 MG tablet Take 1 tablet by mouth every 4 (four) hours as needed for moderate pain. 05/08/18   Willy Eddy, MD  omeprazole (PRILOSEC) 40 MG capsule Take 1 capsule 2 (two) times daily by mouth. 10/01/16 10/01/17  [provider]    Family History  Problem Relation Age of Onset  . Hypertension Mother   . Diabetes Father      Social History   Tobacco Use  . Smoking status: Current Every Day Smoker    Types: Cigarettes  . Smokeless tobacco: Never Used  Substance Use Topics  . Alcohol use: Yes    Comment: occ  . Drug use: No    Allergies as of 07/05/2018  . (No Known Allergies)    Review of Systems:    All systems reviewed and negative except where noted in HPI.   Observations/Objective:  Labs: CBC    Component Value Date/Time   WBC 8.0 05/07/2018 1154   RBC 5.04 05/07/2018 1154   HGB 16.5 05/07/2018 1154   HGB 15.7 05/02/2013 0444  HCT 47.5 05/07/2018 1154   HCT 45.3 05/02/2013 0444   PLT 178 05/07/2018 1154   PLT 179 05/02/2013 0444   MCV 94.2 05/07/2018 1154   MCV 95 05/02/2013 0444   MCH 32.7 05/07/2018 1154   MCHC 34.7 05/07/2018 1154   RDW 13.0 05/07/2018 1154   RDW 13.6 05/02/2013 0444   CMP     Component Value Date/Time   NA 137 05/07/2018 1154   NA 139 05/02/2013 0444   K 4.0 05/07/2018 1154   K 3.3 (L) 05/02/2013 0444   CL 103 05/07/2018 1154   CL 106 05/02/2013 0444   CO2 26 05/07/2018 1154   CO2 26 05/02/2013 0444   GLUCOSE 104 (H) 05/07/2018 1154   GLUCOSE 106 (H) 05/02/2013 0444   BUN 6 05/07/2018 1154   BUN 7 05/02/2013 0444   CREATININE 0.93 05/07/2018 1154   CREATININE 0.92 05/02/2013  0444   CALCIUM 9.5 05/07/2018 1154   CALCIUM 9.1 05/02/2013 0444   PROT 7.0 01/15/2013 0811   ALBUMIN 3.9 01/15/2013 0811   AST 12 (L) 01/15/2013 0811   ALT 22 01/15/2013 0811   ALKPHOS 109 01/15/2013 0811   BILITOT 0.6 01/15/2013 0811   GFRNONAA >60 05/07/2018 1154   GFRNONAA >60 05/02/2013 0444   GFRAA >60 05/07/2018 1154   GFRAA >60 05/02/2013 0444    Imaging Studies: No results found.  Assessment and Plan:   Raymond Hamilton is a 48 y.o. y/o male has been referred for dysphasia.  The patient states that he is on omeprazole 80 mg twice a day and that was prescribed by Dr. Dan Humphreys before he retired.  The patient has been keeping up with that despite doing lifestyle modifications including he has stopped all carbonated drink intake.  The patient has been told to try 40 mg twice a day to see if that keeps his symptoms under control.  The patient has also been told that he will need an upper endoscopy.  The patient will be contacted in the next few weeks to set up an upper endoscopy when the Covid pandemic has resolved.   Follow Up Instructions:   I discussed the assessment and treatment plan with the patient. The patient was provided an opportunity to ask questions and all were answered. The patient agreed with the plan and demonstrated an understanding of the instructions.   The patient was advised to call back or seek an in-person evaluation if the symptoms worsen or if the condition fails to improve as anticipated.  I provided 13 minutes of non-face-to-face time during this encounter.   Midge Minium, MD  Speech recognition software was used to dictate the above note.

## 2018-08-13 ENCOUNTER — Emergency Department
Admission: EM | Admit: 2018-08-13 | Discharge: 2018-08-13 | Disposition: A | Discharge status: Home / Self Care | Payer: BC Managed Care – PPO | Attending: Emergency Medicine | Admitting: Emergency Medicine

## 2018-08-13 ENCOUNTER — Other Ambulatory Visit: Payer: Self-pay

## 2018-08-13 ENCOUNTER — Encounter: Payer: Self-pay | Admitting: Emergency Medicine

## 2018-08-13 ENCOUNTER — Emergency Department: Payer: BC Managed Care – PPO

## 2018-08-13 DIAGNOSIS — K219 Gastro-esophageal reflux disease without esophagitis: Secondary | ICD-10-CM | POA: Insufficient documentation

## 2018-08-13 DIAGNOSIS — J069 Acute upper respiratory infection, unspecified: Secondary | ICD-10-CM

## 2018-08-13 DIAGNOSIS — Z79899 Other long term (current) drug therapy: Secondary | ICD-10-CM | POA: Insufficient documentation

## 2018-08-13 DIAGNOSIS — Z20828 Contact with and (suspected) exposure to other viral communicable diseases: Secondary | ICD-10-CM | POA: Diagnosis not present

## 2018-08-13 DIAGNOSIS — R05 Cough: Secondary | ICD-10-CM | POA: Diagnosis present

## 2018-08-13 DIAGNOSIS — F17210 Nicotine dependence, cigarettes, uncomplicated: Secondary | ICD-10-CM | POA: Insufficient documentation

## 2018-08-13 LAB — BASIC METABOLIC PANEL
Anion gap: 8 (ref 5–15)
BUN: 8 mg/dL (ref 6–20)
CO2: 24 mmol/L (ref 22–32)
Calcium: 9.3 mg/dL (ref 8.9–10.3)
Chloride: 105 mmol/L (ref 98–111)
Creatinine, Ser: 0.88 mg/dL (ref 0.61–1.24)
GFR calc Af Amer: >60 mL/min (ref >60)
GFR calc non Af Amer: >60 mL/min (ref >60)
Glucose, Bld: 146 mg/dL — ABNORMAL HIGH (ref 70–99)
Potassium: 4.2 mmol/L (ref 3.5–5.1)
Sodium: 137 mmol/L (ref 135–145)

## 2018-08-13 LAB — CBC
HCT: 49 % (ref 39.0–52.0)
Hemoglobin: 16.9 g/dL (ref 13.0–17.0)
MCH: 32.9 pg (ref 26.0–34.0)
MCHC: 34.5 g/dL (ref 30.0–36.0)
MCV: 95.5 fL (ref 80.0–100.0)
Platelets: 194 10*3/uL (ref 150–400)
RBC: 5.13 MIL/uL (ref 4.22–5.81)
RDW: 12.9 % (ref 11.5–15.5)
WBC: 9.4 10*3/uL (ref 4.0–10.5)
nRBC: 0 % (ref 0.0–0.2)

## 2018-08-13 LAB — TROPONIN I: Troponin I: <0.03 ng/mL (ref <0.03)

## 2018-08-13 NOTE — ED Triage Notes (Signed)
Pt to ED via POV c/o shortness of breath and cough. Pt also states that he is having pressure in his chest. Pt states that he feels like he has pneumonia. Pt reports that he was treated for a sinus infection 2 weeks ago. Pt reports that he got some better but then settled in his chest. Pt denies fever. Pt is in NAD.

## 2018-08-13 NOTE — ED Provider Notes (Signed)
[name] Provider Note ____________________________________________   First MD Initiated Contact with Patient 08/13/18 1002     (approximate)  I have reviewed the triage vital signs and the nursing notes.   HISTORY  Chief Complaint Shortness of Breath and Cough  Raymond Hamilton was evaluated in Emergency Department on 08/13/2018 for the symptoms described in the history of present illness. He was evaluated in the context of the global COVID-19 pandemic, which necessitated consideration that the patient might be at risk for infection with the SARS-CoV-2 virus that causes COVID-19. Institutional protocols and algorithms that pertain to the evaluation of patients at risk for COVID-19 are in a state of rapid change based on information released by regulatory bodies including the CDC and federal and state organizations. These policies and algorithms were followed during the patient's care in the ED.   HPI Raymond Hamilton is a 48 y.o. male for evaluation for cough some chest congestion  Patient reports 2 weeks ago was treated with amoxicillin for sinus infection and include runny nose, slight scratchy throat.  This improved, had some sinus pain discomfort that improved however he reports that started to go away start develop a dry cough over the last several days.  He felt a little bit short of breath with it when he is up moving about.  Not really chest pain he reports this feels like a congestion feeling in his chest.  No fevers or chills.  No body aches.  No known exposure to coronavirus.  No history of cardiac disease.  Patient reports only treated for acid reflux.  No radiating chest pain, no heavy chest pressure.  Symptoms ongoing for several days with a bit of a cough.   Past Medical History:  Diagnosis Date   GERD (gastroesophageal reflux disease)    HLD (hyperlipidemia)    Neck pain     Patient Active Problem List   Diagnosis Date  Noted   Smoker 03/01/2017   Chest pain 02/16/2017   GERD (gastroesophageal reflux disease) 02/16/2017   Chronic neck pain 06/10/2014   History of peptic ulcer disease 06/10/2014   Hyperlipidemia 06/10/2014    Past Surgical History:  Procedure Laterality Date   TUMOR EXCISION      Prior to Admission medications   Medication Sig Start Date End Date Taking? Authorizing Provider  gabapentin (NEURONTIN) 100 MG capsule TAKE 1 CAPSULE (100 MG TOTAL) BY MOUTH 2 (TWO) TIMES DAILY. 12/22/16   [provider]  HYDROcodone-acetaminophen (NORCO) 5-325 MG tablet Take 1 tablet by mouth every 4 (four) hours as needed for moderate pain. Patient not taking: Reported on 07/05/2018 05/08/18   Willy Eddy, MD  omeprazole (PRILOSEC) 40 MG capsule Take 1 capsule 2 (two) times daily by mouth. 10/01/16 10/01/17  [provider]  omeprazole (PRILOSEC) 40 MG capsule Take by mouth daily. 05/12/18   [provider]    Allergies Patient has no known allergies.  Family History  Problem Relation Age of Onset   Hypertension Mother    Diabetes Father     Social History Social History   Tobacco Use   Smoking status: Current Every Day Smoker    Types: Cigarettes   Smokeless tobacco: Never Used  Substance Use Topics   Alcohol use: Yes    Comment: occ   Drug use: No    Review of Systems Constitutional: No fever/chills Eyes: No visual changes. ENT: No sore throat. Cardiovascular: Denies chest pain. Respiratory: See HPI.  Patient  reports he wants to make sure he does not have pneumonia since it seems like his sinus infection is "moved to the lungs".  Denies wheezing.  Did not feel short of breath at the present time. Gastrointestinal: No abdominal pain.   Genitourinary: Negative for dysuria. Musculoskeletal: Negative for back pain. Skin: Negative for rash. Neurological: Negative for headaches, areas of focal weakness or  numbness.    ____________________________________________   PHYSICAL EXAM:  VITAL SIGNS: ED Triage Vitals  Enc Vitals Group     BP 08/13/18 0945 (!) 147/84     Pulse Rate 08/13/18 0945 93     Resp 08/13/18 0945 16     Temp 08/13/18 0945 98.7 F (37.1 C)     Temp Source 08/13/18 0945 Oral     SpO2 08/13/18 0945 97 %     Weight 08/13/18 0946 197 lb (89.4 kg)     Height 08/13/18 0946 6\' 1"  (1.854 m)     Head Circumference --      Peak Flow --      Pain Score 08/13/18 0946 5     Pain Loc --      Pain Edu? --      Excl. in GC? --     Constitutional: Alert and oriented. Well appearing and in no acute distress. Eyes: Conjunctivae are normal. Head: Atraumatic. Nose: No congestion/rhinnorhea. Mouth/Throat: Mucous membranes are moist. Neck: No stridor.  Cardiovascular: Normal rate, regular rhythm. Grossly normal heart sounds.  Good peripheral circulation. Respiratory: Normal respiratory effort.  No retractions. Lungs CTAB.  Clear lungs with normal effort.  No wheezing.  Does have an occasional dry cough but in no distress at all. Gastrointestinal: Soft and nontender. No distention. Musculoskeletal: No lower extremity tenderness nor edema. Neurologic:  Normal speech and language. No gross focal neurologic deficits are appreciated.  Skin:  Skin is warm, dry and intact. No rash noted. Psychiatric: Mood and affect are normal. Speech and behavior are normal.  ____________________________________________   LABS (all labs ordered are listed, but only abnormal results are displayed)  Labs Reviewed  BASIC METABOLIC PANEL - Abnormal; Notable for the following components:      Result Value   Glucose, Bld 146 (*)    All other components within normal limits  NOVEL CORONAVIRUS, NAA (HOSPITAL ORDER, SEND-OUT TO REF LAB)  CBC  TROPONIN I   ____________________________________________  EKG  Reviewed and interpreted by me at 9:45 AM Heart rate 85 QRS 100 QTc 440 Normal sinus  rhythm, no evidence of acute ischemia, there is the slightest hint of a minimal depression which may be artifactual in V3, also a little bit in V4, but seems to only be evident in a couple of the beats, not the last beat of V3 ____________________________________________  RADIOLOGY  Dg Chest Port 1 View  Result Date: 08/13/2018 CLINICAL DATA:  Shortness of breath, cough, chest pressure. Pt states that he feels like he has pneumonia. Current smoker. SHORTNESS OF BREATH COUGH EXAM: PORTABLE CHEST 1 VIEW COMPARISON:  None. FINDINGS: Normal mediastinum and cardiac silhouette. Normal pulmonary vasculature. No evidence of effusion, infiltrate, or pneumothorax. No acute bony abnormality. IMPRESSION: Normal chest radiograph. Electronically Signed   By: Genevive Bi M.D.   On: 08/13/2018 10:37    Reviewed negative for acute ____________________________________________   PROCEDURES  Procedure(s) performed: None  Procedures  Critical Care performed: No  ____________________________________________   INITIAL IMPRESSION / ASSESSMENT AND PLAN / ED COURSE  Pertinent labs & imaging results that were available  during my care of the patient were reviewed by me and considered in my medical decision making (see chart for details).   Patient comes for evaluation of dry cough following recent upper respiratory infection treated amoxicillin.  Well-appearing afebrile in no acute distress.  Symptoms do not appear to be consistent with ACS, slightest abnormality is noted in his EKG but he denies any sort of heavy chest pressure, radiating pain, and he reports primarily cough with slight congestion as his only symptom at present.  Is very reassuring nontoxic and well-appearing.  Given events of today, a coronavirus test will be sent though I suspect he likely does not have it.  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  We will send lab work, obtain troponin, and if these reassuring along with x-ray would anticipate discharge likely viral upper respiratory symptom.    Raymond Hamilton was evaluated in Emergency Department on 08/13/2018 for the symptoms described in the history of present illness. He was evaluated in the context of the global COVID-19 pandemic, which necessitated consideration that the patient might be at risk for infection with the SARS-CoV-2 virus that causes COVID-19. Institutional protocols and algorithms that pertain to the evaluation of patients at risk for COVID-19 are in a state of rapid change based on information released by regulatory bodies including the CDC and federal and state organizations. These policies and algorithms were followed during the patient's care in the ED.   ----------------------------------------- 12:07 PM on 08/13/2018 -----------------------------------------  Patient reports feels well.  Discussed plan of care and careful return precautions as well as is pending coronavirus testing with the patient via phone.  He is comfortable with plan for discharge and home care.  Return precautions and treatment recommendations and follow-up discussed with the patient who is agreeable with the plan.   ____________________________________________   FINAL CLINICAL IMPRESSION(S) / ED DIAGNOSES  Final diagnoses:  Viral upper respiratory illness        Note:  This document was prepared using Dragon voice recognition software and may include unintentional dictation errors       Sharyn CreamerQuale, Gilmar Bua, MD 08/13/18 1207

## 2018-08-13 NOTE — Discharge Instructions (Addendum)
Person Under Monitoring Name: Raymond Hamilton  Location: [address] [place] [address]   Infection Prevention Recommendations for Individuals Confirmed to have, or Being Evaluated for, 2019 Novel Coronavirus (COVID-19) Infection Who Receive Care at Home  Individuals who are confirmed to have, or are being evaluated for, COVID-19 should follow the prevention steps below until a healthcare provider or local or state health department says they can return to normal activities.  Your COVID-19 test has been sent out and should return with 3-4 days with a result. For now, we will treat you as unlikely (but possibly could have) COVID-19.  Stay home except to get medical care You should restrict activities outside your home, except for getting medical care. Do not go to work, school, or public areas, and do not use public transportation or taxis.  Call ahead before visiting your doctor Before your medical appointment, call the healthcare provider and tell them that you have, or are being evaluated for, COVID-19 infection. This will help the healthcare providers office take steps to keep other people from getting infected. Ask your healthcare provider to call the local or state health department.  Monitor your symptoms Seek prompt medical attention if your illness is worsening (e.g., difficulty breathing). Before going to your medical appointment, call the healthcare provider and tell them that you have, or are being evaluated for, COVID-19 infection. Ask your healthcare provider to call the local or state health department.  Wear a facemask You should wear a facemask that covers your nose and mouth when you are in the same room with other people and when you visit a healthcare provider. People who live with or visit you should also wear a facemask while they are in the same room with you.  Separate yourself from other people in your home As much as possible,  you should stay in a different room from other people in your home. Also, you should use a separate bathroom, if available.  Avoid sharing household items You should not share dishes, drinking glasses, cups, eating utensils, towels, bedding, or other items with other people in your home. After using these items, you should wash them thoroughly with soap and water.  Cover your coughs and sneezes Cover your mouth and nose with a tissue when you cough or sneeze, or you can cough or sneeze into your sleeve. Throw used tissues in a lined trash can, and immediately wash your hands with soap and water for at least 20 seconds or use an alcohol-based hand rub.  Wash your [employer] your hands often and thoroughly with soap and water for at least 20 seconds. You can use an alcohol-based hand sanitizer if soap and water are not available and if your hands are not visibly dirty. Avoid touching your eyes, nose, and mouth with unwashed hands.   Prevention Steps for Caregivers and Household Members of Individuals Confirmed to have, or Being Evaluated for, COVID-19 Infection Being Cared for in the Home  If you live with, or provide care at home for, a person confirmed to have, or being evaluated for, COVID-19 infection please follow these guidelines to prevent infection:  Follow healthcare providers instructions Make sure that you understand and can help the patient follow any healthcare provider instructions for all care.  Provide for the patients basic needs You should help the patient with basic needs in the home and provide support for getting groceries, prescriptions, and other personal needs.  Monitor the patients  symptoms If they are getting sicker, call his or her medical provider and tell them that the patient has, or is being evaluated for, COVID-19 infection. This will help the healthcare providers office take steps to keep other people from getting infected. Ask the healthcare  provider to call the local or state health department.  Limit the number of people who have contact with the patient If possible, have only one caregiver for the patient. Other household members should stay in another home or place of residence. If this is not possible, they should stay in another room, or be separated from the patient as much as possible. Use a separate bathroom, if available. Restrict visitors who do not have an essential need to be in the home.  Keep older adults, very young children, and other sick people away from the patient Keep older adults, very young children, and those who have compromised immune systems or chronic health conditions away from the patient. This includes people with chronic heart, lung, or kidney conditions, diabetes, and cancer.  Ensure good ventilation Make sure that shared spaces in the home have good air flow, such as from an air conditioner or an opened window, weather permitting.  Wash your hands often Wash your hands often and thoroughly with soap and water for at least 20 seconds. You can use an alcohol based hand sanitizer if soap and water are not available and if your hands are not visibly dirty. Avoid touching your eyes, nose, and mouth with unwashed hands. Use disposable paper towels to dry your hands. If not available, use dedicated cloth towels and replace them when they become wet.  Wear a facemask and gloves Wear a disposable facemask at all times in the room and gloves when you touch or have contact with the patients blood, body fluids, and/or secretions or excretions, such as sweat, saliva, sputum, nasal mucus, vomit, urine, or feces.  Ensure the mask fits over your nose and mouth tightly, and do not touch it during use. Throw out disposable facemasks and gloves after using them. Do not reuse. Wash your hands immediately after removing your facemask and gloves. If your personal clothing becomes contaminated, carefully remove  clothing and launder. Wash your hands after handling contaminated clothing. Place all used disposable facemasks, gloves, and other waste in a lined container before disposing them with other household waste. Remove gloves and wash your hands immediately after handling these items.  Do not share dishes, glasses, or other household items with the patient Avoid sharing household items. You should not share dishes, drinking glasses, cups, eating utensils, towels, bedding, or other items with a patient who is confirmed to have, or being evaluated for, COVID-19 infection. After the person uses these items, you should wash them thoroughly with soap and water.  Wash laundry thoroughly Immediately remove and wash clothes or bedding that have blood, body fluids, and/or secretions or excretions, such as sweat, saliva, sputum, nasal mucus, vomit, urine, or feces, on them. Wear gloves when handling laundry from the patient. Read and follow directions on labels of laundry or clothing items and detergent. In general, wash and dry with the warmest temperatures recommended on the label.  Clean all areas the individual has used often Clean all touchable surfaces, such as counters, tabletops, doorknobs, bathroom fixtures, toilets, phones, keyboards, tablets, and bedside tables, every day. Also, clean any surfaces that may have blood, body fluids, and/or secretions or excretions on them. Wear gloves when cleaning surfaces the patient has come in contact  with. Use a diluted bleach solution (e.g., dilute bleach with 1 part bleach and 10 parts water) or a household disinfectant with a label that says EPA-registered for coronaviruses. To make a bleach solution at home, add 1 tablespoon of bleach to 1 quart (4 cups) of water. For a larger supply, add  cup of bleach to 1 gallon (16 cups) of water. Read labels of cleaning products and follow recommendations provided on product labels. Labels contain instructions for safe and  effective use of the cleaning product including precautions you should take when applying the product, such as wearing gloves or eye protection and making sure you have good ventilation during use of the product. Remove gloves and wash hands immediately after cleaning.  Monitor yourself for signs and symptoms of illness Caregivers and household members are considered close contacts, should monitor their health, and will be asked to limit movement outside of the home to the extent possible. Follow the monitoring steps for close contacts listed on the symptom monitoring form.   ? If you have additional questions, contact your local health department or call the epidemiologist on call at [phone] (available 24/7). ? This guidance is subject to change. For the most up-to-date guidance from [facility], please refer to their website: [email or web address]hu

## 2018-08-13 NOTE — ED Notes (Signed)
Collected blue top per lab request, sent to lab for testing.

## 2018-08-14 LAB — NOVEL CORONAVIRUS, NAA (HOSP ORDER, SEND-OUT TO REF LAB; TAT 18-24 HRS): SARS-CoV-2, NAA: NOT DETECTED

## 2018-08-15 ENCOUNTER — Telehealth: Payer: Self-pay | Admitting: Emergency Medicine

## 2018-08-15 NOTE — Telephone Encounter (Signed)
Called pateint and informed him of negative covid 19 result.

## 2018-09-05 ENCOUNTER — Other Ambulatory Visit: Payer: Self-pay

## 2018-09-05 DIAGNOSIS — R1310 Dysphagia, unspecified: Secondary | ICD-10-CM

## 2018-09-14 ENCOUNTER — Other Ambulatory Visit: Payer: Self-pay

## 2018-09-14 ENCOUNTER — Other Ambulatory Visit
Admission: RE | Admit: 2018-09-14 | Discharge: 2018-09-14 | Disposition: A | Discharge status: Home / Self Care | Payer: BC Managed Care – PPO | Source: Ambulatory Visit | Attending: Gastroenterology | Admitting: Gastroenterology

## 2018-09-14 DIAGNOSIS — Z1159 Encounter for screening for other viral diseases: Secondary | ICD-10-CM | POA: Insufficient documentation

## 2018-09-15 LAB — NOVEL CORONAVIRUS, NAA (HOSP ORDER, SEND-OUT TO REF LAB; TAT 18-24 HRS): SARS-CoV-2, NAA: NOT DETECTED

## 2018-09-19 ENCOUNTER — Encounter
Admission: RE | Disposition: A | Discharge status: Home / Self Care | Payer: Self-pay | Source: Home / Self Care | Attending: Gastroenterology

## 2018-09-19 ENCOUNTER — Ambulatory Visit: Payer: BC Managed Care – PPO | Admitting: Certified Registered Nurse Anesthetist

## 2018-09-19 ENCOUNTER — Encounter: Payer: Self-pay | Admitting: *Deleted

## 2018-09-19 ENCOUNTER — Other Ambulatory Visit: Payer: Self-pay

## 2018-09-19 ENCOUNTER — Ambulatory Visit
Admission: RE | Admit: 2018-09-19 | Discharge: 2018-09-19 | Disposition: A | Discharge status: Home / Self Care | Payer: BC Managed Care – PPO | Attending: Gastroenterology | Admitting: Gastroenterology

## 2018-09-19 DIAGNOSIS — K222 Esophageal obstruction: Secondary | ICD-10-CM

## 2018-09-19 DIAGNOSIS — F17210 Nicotine dependence, cigarettes, uncomplicated: Secondary | ICD-10-CM | POA: Insufficient documentation

## 2018-09-19 DIAGNOSIS — R1310 Dysphagia, unspecified: Secondary | ICD-10-CM | POA: Diagnosis not present

## 2018-09-19 DIAGNOSIS — K219 Gastro-esophageal reflux disease without esophagitis: Secondary | ICD-10-CM | POA: Insufficient documentation

## 2018-09-19 HISTORY — PX: ESOPHAGOGASTRODUODENOSCOPY (EGD) WITH PROPOFOL: SHX5813

## 2018-09-19 SURGERY — ESOPHAGOGASTRODUODENOSCOPY (EGD) WITH PROPOFOL
Anesthesia: General

## 2018-09-19 MED ORDER — PROPOFOL 10 MG/ML IV BOLUS
INTRAVENOUS | Status: DC | PRN
  Administered 2018-09-19: 50 mg via INTRAVENOUS
  Administered 2018-09-19: 30 mg via INTRAVENOUS

## 2018-09-19 MED ORDER — SODIUM CHLORIDE 0.9 % IV SOLN
INTRAVENOUS | Status: DC
  Administered 2018-09-19: 09:00:00 1000 mL via INTRAVENOUS

## 2018-09-19 MED ORDER — LIDOCAINE HCL (CARDIAC) PF 100 MG/5ML IV SOSY
PREFILLED_SYRINGE | INTRAVENOUS | Status: DC | PRN
  Administered 2018-09-19: 50 mg via INTRAVENOUS

## 2018-09-19 MED ORDER — PROPOFOL 500 MG/50ML IV EMUL
INTRAVENOUS | Status: DC | PRN
  Administered 2018-09-19: 120 ug/kg/min via INTRAVENOUS

## 2018-09-19 NOTE — Op Note (Addendum)
[facility] Gastroenterology Patient Name: Raymond Hamilton Procedure Date: 09/19/2018 8:52 AM MRN: [id number] Account #: [id number] Date of Birth: Apr 14, 1970 Admit Type: Outpatient Age: 48 Room: [facility] ENDO ROOM 4 Gender: Male Note Status: Finalized Procedure:            Upper GI endoscopy Indications:          Dysphagia Providers:            Midge Miniumarren Jakob Kimberlin MD, MD Medicines:            Propofol per Anesthesia Complications:        No immediate complications. Procedure:            Pre-Anesthesia Assessment:                       - Prior to the procedure, a History and Physical was                        performed, and patient medications and allergies were                        reviewed. The patient's tolerance of previous                        anesthesia was also reviewed. The risks and benefits of                        the procedure and the sedation options and risks were                        discussed with the patient. All questions were                        answered, and informed consent was obtained. Prior                        Anticoagulants: The patient has taken no previous                        anticoagulant or antiplatelet agents. ASA Grade                        Assessment: II - A patient with mild systemic disease.                        After reviewing the risks and benefits, the patient was                        deemed in satisfactory condition to undergo the                        procedure.                       After obtaining informed consent, the endoscope was                        passed under direct vision. Throughout the procedure,                        the patient's blood pressure, pulse, and  oxygen                        saturations were monitored continuously. The Endoscope                        was introduced through the mouth, and advanced to the                        second part of duodenum. The upper GI endoscopy was             accomplished without difficulty. The patient tolerated                        the procedure well. Findings:      One benign-appearing, intrinsic mild stenosis was found at the       gastroesophageal junction. The stenosis was traversed. A TTS dilator was       passed through the scope. Dilation with a 15-16.5-18 mm balloon dilator       was performed to 18 mm. The dilation site was examined following       endoscope reinsertion and showed complete resolution of luminal       narrowing.      The stomach was normal.      The examined duodenum was normal. Impression:           - Benign-appearing esophageal stenosis. Dilated.                       - Normal stomach.                       - Normal examined duodenum.                       - No specimens collected. Recommendation:       - Discharge patient to home.                       - Resume previous diet.                       - Continue present medications.                       - Repeat upper endoscopy PRN for retreatment. Procedure Code(s):    --- Professional ---                       [id number], Esophagogastroduodenoscopy, flexible, transoral;                        with transendoscopic balloon dilation of esophagus                        (less than 30 mm diameter) Diagnosis Code(s):    --- Professional ---                       R13.10, Dysphagia, unspecified                       K22.2, Esophageal obstruction CPT copyright 2019 American Medical Association. All rights reserved. The codes documented in this report are preliminary and upon coder review may  be revised to meet current compliance requirements. Lucilla Lame MD, MD 09/19/2018 9:10:56 AM This report has been signed electronically. Number of Addenda: 0 Note Initiated On: 09/19/2018 8:52 AM Estimated Blood Loss: Estimated blood loss: none.      [facility]

## 2018-09-19 NOTE — Anesthesia Preprocedure Evaluation (Addendum)
Anesthesia Evaluation  Patient identified by MRN, date of birth, ID band Patient awake    Reviewed: Allergy & Precautions, H&P , NPO status , Patient's Chart, lab work & pertinent test results  Airway Mallampati: II  TM Distance: >3 FB     Dental  (+) Poor Dentition, Chipped   Pulmonary Current Smoker,           Cardiovascular negative cardio ROS       Neuro/Psych negative neurological ROS  negative psych ROS   GI/Hepatic Neg liver ROS, GERD  Controlled,  Endo/Other  negative endocrine ROS  Renal/GU negative Renal ROS  negative genitourinary   Musculoskeletal   Abdominal   Peds  Hematology negative hematology ROS (+)   Anesthesia Other Findings Past Medical History: No date: GERD (gastroesophageal reflux disease) No date: HLD (hyperlipidemia) No date: Neck pain  Past Surgical History: No date: TUMOR EXCISION  BMI    Body Mass Index: 25.73 kg/m      Reproductive/Obstetrics negative OB ROS                           Anesthesia Physical Anesthesia Plan  ASA: II  Anesthesia Plan: General   Post-op Pain Management:    Induction:   PONV Risk Score and Plan: Propofol infusion and TIVA  Airway Management Planned: Natural Airway and Nasal Cannula  Additional Equipment:   Intra-op Plan:   Post-operative Plan:   Informed Consent: I have reviewed the patients History and Physical, chart, labs and discussed the procedure including the risks, benefits and alternatives for the proposed anesthesia with the patient or authorized representative who has indicated his/her understanding and acceptance.     Dental Advisory Given  Plan Discussed with: Anesthesiologist and CRNA  Anesthesia Plan Comments:        Anesthesia Quick Evaluation

## 2018-09-19 NOTE — Anesthesia Post-op Follow-up Note (Signed)
Anesthesia QCDR form completed.        

## 2018-09-19 NOTE — Anesthesia Postprocedure Evaluation (Signed)
Anesthesia Post Note  Patient: Raymond Hamilton  Procedure(s) Performed: ESOPHAGOGASTRODUODENOSCOPY (EGD) WITH PROPOFOL (N/A )  Patient location during evaluation: PACU Anesthesia Type: General Level of consciousness: awake and alert Pain management: pain level controlled Vital Signs Assessment: post-procedure vital signs reviewed and stable Respiratory status: spontaneous breathing, nonlabored ventilation and respiratory function stable Cardiovascular status: blood pressure returned to baseline and stable Postop Assessment: no apparent nausea or vomiting Anesthetic complications: no     Last Vitals:  Vitals:   09/19/18 0930 09/19/18 0940  BP: 140/88 120/84  Pulse: 82 79  Resp: 18 15  Temp:    SpO2: 98% 99%    Last Pain:  Vitals:   09/19/18 0910  TempSrc: Tympanic  PainSc:                  Durenda Hurt

## 2018-09-19 NOTE — Transfer of Care (Signed)
Immediate Anesthesia Transfer of Care Note  Patient: Raymond Hamilton  Procedure(s) Performed: ESOPHAGOGASTRODUODENOSCOPY (EGD) WITH PROPOFOL (N/A )  Patient Location: PACU  Anesthesia Type:General  Level of Consciousness: awake, alert  and oriented  Airway & Oxygen Therapy: Patient Spontanous Breathing  Post-op Assessment: Report given to RN and Post -op Vital signs reviewed and stable  Post vital signs: Reviewed and stable  Last Vitals:  Vitals Value Taken Time  BP    Temp    Pulse    Resp    SpO2      Last Pain:  Vitals:   09/19/18 0836  TempSrc: Tympanic  PainSc: 0-No pain         Complications: No apparent anesthesia complications

## 2018-09-19 NOTE — H&P (Signed)
Lucilla Lame, MD [facility] [address] [place], [place] [address] [phone] Fax : [phone]  Primary Care Physician:  Baxter Hire, MD Primary Gastroenterologist:  Dr. Allen Norris  Pre-Procedure History & Physical: HPI:  Raymond Hamilton is a 48 y.o. male is here for an endoscopy.   Past Medical History:  Diagnosis Date  . GERD (gastroesophageal reflux disease)   . HLD (hyperlipidemia)   . Neck pain     Past Surgical History:  Procedure Laterality Date  . TUMOR EXCISION      Prior to Admission medications   Medication Sig Start Date End Date Taking? Authorizing Provider  gabapentin (NEURONTIN) 100 MG capsule TAKE 1 CAPSULE (100 MG TOTAL) BY MOUTH 2 (TWO) TIMES DAILY. 12/22/16  Yes [provider]  omeprazole (PRILOSEC) 40 MG capsule Take by mouth daily. 05/12/18  Yes [provider]  HYDROcodone-acetaminophen (NORCO) 5-325 MG tablet Take 1 tablet by mouth every 4 (four) hours as needed for moderate pain. Patient not taking: Reported on 07/05/2018 05/08/18   Merlyn Lot, MD  omeprazole (PRILOSEC) 40 MG capsule Take 1 capsule 2 (two) times daily by mouth. 10/01/16 10/01/17  [provider]    Allergies as of 09/05/2018  . (No Known Allergies)    Family History  Problem Relation Age of Onset  . Hypertension Mother   . Diabetes Father     Social History   Socioeconomic History  . Marital status: Married    Spouse name: Not on file  . Number of children: Not on file  . Years of education: Not on file  . Highest education level: Not on file  Occupational History  . Not on file  Social Needs  . Financial resource strain: Not on file  . Food insecurity    Worry: Not on file    Inability: Not on file  . Transportation needs    Medical: Not on file    Non-medical: Not on file  Tobacco Use  . Smoking status: Current Every Day Smoker    Types: Cigarettes  . Smokeless tobacco: Never Used  Substance and Sexual Activity  .  Alcohol use: Yes    Comment: occ  . Drug use: Never  . Sexual activity: Not on file  Lifestyle  . Physical activity    Days per week: Not on file    Minutes per session: Not on file  . Stress: Not on file  Relationships  . Social Herbalist on phone: Not on file    Gets together: Not on file    Attends religious service: Not on file    Active member of club or organization: Not on file    Attends meetings of clubs or organizations: Not on file    Relationship status: Not on file  . Intimate partner violence    Fear of current or ex partner: Not on file    Emotionally abused: Not on file    Physically abused: Not on file    Forced sexual activity: Not on file  Other Topics Concern  . Not on file  Social History Narrative  . Not on file    Review of Systems: See HPI, otherwise negative ROS  Physical Exam: BP 118/81   Pulse 77   Temp 97.8 F (36.6 C) (Tympanic)   Resp 18   Ht 6\' 1"  (1.854 m)   Wt 88.5 kg   SpO2 100%   BMI 25.73 kg/m  General:   Alert,  pleasant and cooperative in NAD Head:  Normocephalic and atraumatic. Neck:  Supple; no masses or thyromegaly. Lungs:  Clear throughout to auscultation.    Heart:  Regular rate and rhythm. Abdomen:  Soft, nontender and nondistended. Normal bowel sounds, without guarding, and without rebound.   Neurologic:  Alert and  oriented x4;  grossly normal neurologically.  Impression/Plan: Raymond Hamilton is here for an endoscopy to be performed for dysphagia  Risks, benefits, limitations, and alternatives regarding  endoscopy have been reviewed with the patient.  Questions have been answered.  All parties agreeable.   Midge Miniumarren Han Vejar, MD  09/19/2018, 8:55 AM

## 2018-09-20 ENCOUNTER — Encounter: Payer: Self-pay | Admitting: Gastroenterology

## 2018-10-26 ENCOUNTER — Encounter: Payer: Self-pay | Admitting: Emergency Medicine

## 2018-10-26 ENCOUNTER — Other Ambulatory Visit: Payer: Self-pay

## 2018-10-26 DIAGNOSIS — Y999 Unspecified external cause status: Secondary | ICD-10-CM | POA: Diagnosis not present

## 2018-10-26 DIAGNOSIS — Y9289 Other specified places as the place of occurrence of the external cause: Secondary | ICD-10-CM | POA: Insufficient documentation

## 2018-10-26 DIAGNOSIS — X088XXA Exposure to other specified smoke, fire and flames, initial encounter: Secondary | ICD-10-CM | POA: Diagnosis not present

## 2018-10-26 DIAGNOSIS — Y9389 Activity, other specified: Secondary | ICD-10-CM | POA: Insufficient documentation

## 2018-10-26 DIAGNOSIS — F17210 Nicotine dependence, cigarettes, uncomplicated: Secondary | ICD-10-CM | POA: Insufficient documentation

## 2018-10-26 DIAGNOSIS — T23092A Burn of unspecified degree of multiple sites of left wrist and hand, initial encounter: Secondary | ICD-10-CM | POA: Diagnosis present

## 2018-10-26 DIAGNOSIS — T23192A Burn of first degree of multiple sites of left wrist and hand, initial encounter: Secondary | ICD-10-CM | POA: Diagnosis not present
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
## 2018-10-26 NOTE — ED Notes (Signed)
Fingers wrapped in NS soaked guaze.

## 2018-10-27 ENCOUNTER — Emergency Department
Admission: EM | Admit: 2018-10-27 | Discharge: 2018-10-27 | Disposition: A | Discharge status: Home / Self Care | Payer: BC Managed Care – PPO | Attending: Emergency Medicine | Admitting: Emergency Medicine

## 2018-10-27 DIAGNOSIS — T23192A Burn of first degree of multiple sites of left wrist and hand, initial encounter: Secondary | ICD-10-CM

## 2018-10-27 MED ORDER — SILVER SULFADIAZINE 1 % EX CREA: TOPICAL_CREAM | CUTANEOUS | 1 refills | Status: AC

## 2018-10-27 MED ORDER — OXYCODONE HCL 5 MG PO TABS
5.0000 mg | ORAL_TABLET | Freq: Once | ORAL | Status: AC
  Administered 2018-10-27: 02:00:00 5 mg via ORAL
  Filled 2018-10-27: qty 1

## 2018-10-27 MED ORDER — SILVER SULFADIAZINE 1 % EX CREA
TOPICAL_CREAM | Freq: Once | CUTANEOUS | Status: AC
  Administered 2018-10-27: 02:00:00 via TOPICAL

## 2018-10-27 MED ORDER — OXYCODONE-ACETAMINOPHEN 5-325 MG PO TABS: 1.0000 | ORAL_TABLET | ORAL | 0 refills | Status: DC | PRN

## 2018-10-27 MED ORDER — IBUPROFEN 600 MG PO TABS
600.0000 mg | ORAL_TABLET | Freq: Once | ORAL | Status: AC
  Administered 2018-10-27: 02:00:00 600 mg via ORAL
  Filled 2018-10-27: qty 1
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
## 2018-10-27 NOTE — Discharge Instructions (Signed)
Apply Silvadene once a day with dressing changes.  Keep your hand dry and clean.  Follow-up with [facility] burn clinic on Monday.

## 2019-02-21 ENCOUNTER — Other Ambulatory Visit: Payer: Self-pay | Admitting: Physician Assistant

## 2019-02-21 ENCOUNTER — Other Ambulatory Visit: Payer: Self-pay

## 2019-02-21 ENCOUNTER — Ambulatory Visit
Admission: RE | Admit: 2019-02-21 | Discharge: 2019-02-21 | Disposition: A | Discharge status: Home / Self Care | Payer: BC Managed Care – PPO | Source: Ambulatory Visit | Attending: Physician Assistant | Admitting: Physician Assistant

## 2019-02-21 DIAGNOSIS — R1031 Right lower quadrant pain: Secondary | ICD-10-CM | POA: Insufficient documentation

## 2019-02-21 DIAGNOSIS — R109 Unspecified abdominal pain: Secondary | ICD-10-CM | POA: Diagnosis present

## 2019-02-25 ENCOUNTER — Emergency Department: Payer: BC Managed Care – PPO

## 2019-02-25 ENCOUNTER — Other Ambulatory Visit: Payer: Self-pay

## 2019-02-25 ENCOUNTER — Emergency Department
Admission: EM | Admit: 2019-02-25 | Discharge: 2019-02-25 | Disposition: A | Discharge status: Home / Self Care | Payer: BC Managed Care – PPO | Attending: Emergency Medicine | Admitting: Emergency Medicine

## 2019-02-25 ENCOUNTER — Encounter: Payer: Self-pay | Admitting: Emergency Medicine

## 2019-02-25 DIAGNOSIS — R102 Pelvic and perineal pain: Secondary | ICD-10-CM | POA: Insufficient documentation

## 2019-02-25 DIAGNOSIS — M545 Low back pain, unspecified: Secondary | ICD-10-CM

## 2019-02-25 DIAGNOSIS — R112 Nausea with vomiting, unspecified: Secondary | ICD-10-CM | POA: Diagnosis not present

## 2019-02-25 DIAGNOSIS — R1030 Lower abdominal pain, unspecified: Secondary | ICD-10-CM | POA: Diagnosis present

## 2019-02-25 DIAGNOSIS — F17210 Nicotine dependence, cigarettes, uncomplicated: Secondary | ICD-10-CM | POA: Insufficient documentation

## 2019-02-25 DIAGNOSIS — Z79899 Other long term (current) drug therapy: Secondary | ICD-10-CM | POA: Insufficient documentation

## 2019-02-25 LAB — LIPASE, BLOOD: Lipase: 32 U/L (ref 11–51)

## 2019-02-25 LAB — COMPREHENSIVE METABOLIC PANEL
ALT: 26 U/L (ref 0–44)
AST: 31 U/L (ref 15–41)
Albumin: 4.3 g/dL (ref 3.5–5.0)
Alkaline Phosphatase: 86 U/L (ref 38–126)
Anion gap: 11 (ref 5–15)
BUN: 8 mg/dL (ref 6–20)
CO2: 23 mmol/L (ref 22–32)
Calcium: 9.2 mg/dL (ref 8.9–10.3)
Chloride: 104 mmol/L (ref 98–111)
Creatinine, Ser: 0.81 mg/dL (ref 0.61–1.24)
GFR calc Af Amer: >60 mL/min (ref >60)
GFR calc non Af Amer: >60 mL/min (ref >60)
Glucose, Bld: 129 mg/dL — ABNORMAL HIGH (ref 70–99)
Potassium: 4 mmol/L (ref 3.5–5.1)
Sodium: 138 mmol/L (ref 135–145)
Total Bilirubin: 0.7 mg/dL (ref 0.3–1.2)
Total Protein: 7.3 g/dL (ref 6.5–8.1)

## 2019-02-25 LAB — URINALYSIS, COMPLETE (UACMP) WITH MICROSCOPIC
Bacteria, UA: NONE SEEN
Bilirubin Urine: NEGATIVE
Glucose, UA: NEGATIVE mg/dL
Hgb urine dipstick: NEGATIVE
Ketones, ur: NEGATIVE mg/dL
Leukocytes,Ua: NEGATIVE
Nitrite: NEGATIVE
Protein, ur: NEGATIVE mg/dL
Specific Gravity, Urine: 1.003 — ABNORMAL LOW (ref 1.005–1.030)
Squamous Epithelial / HPF: NONE SEEN (ref 0–5)
pH: 7 (ref 5.0–8.0)

## 2019-02-25 LAB — CBC
HCT: 47 % (ref 39.0–52.0)
Hemoglobin: 16.8 g/dL (ref 13.0–17.0)
MCH: 34.3 pg — ABNORMAL HIGH (ref 26.0–34.0)
MCHC: 35.7 g/dL (ref 30.0–36.0)
MCV: 95.9 fL (ref 80.0–100.0)
Platelets: 154 10*3/uL (ref 150–400)
RBC: 4.9 MIL/uL (ref 4.22–5.81)
RDW: 12.5 % (ref 11.5–15.5)
WBC: 8.7 10*3/uL (ref 4.0–10.5)
nRBC: 0 % (ref 0.0–0.2)

## 2019-02-25 MED ORDER — SODIUM CHLORIDE 0.9% FLUSH
3.0000 mL | Freq: Once | INTRAVENOUS | Status: AC
  Administered 2019-02-25: 09:00:00 3 mL via INTRAVENOUS

## 2019-02-25 MED ORDER — IOHEXOL 300 MG/ML  SOLN
100.0000 mL | Freq: Once | INTRAMUSCULAR | Status: AC | PRN
  Administered 2019-02-25: 100 mL via INTRAVENOUS

## 2019-02-25 MED ORDER — ONDANSETRON HCL 4 MG/2ML IJ SOLN
4.0000 mg | Freq: Once | INTRAMUSCULAR | Status: AC
  Administered 2019-02-25: 4 mg via INTRAVENOUS
  Filled 2019-02-25: qty 2

## 2019-02-25 MED ORDER — ONDANSETRON 8 MG PO TBDP: 8.0000 mg | ORAL_TABLET | Freq: Three times a day (TID) | ORAL | 0 refills | Status: DC | PRN

## 2019-02-25 MED ORDER — KETOROLAC TROMETHAMINE 30 MG/ML IJ SOLN
30.0000 mg | Freq: Once | INTRAMUSCULAR | Status: AC
  Administered 2019-02-25: 30 mg via INTRAVENOUS
  Filled 2019-02-25: qty 1

## 2019-02-25 NOTE — ED Triage Notes (Signed)
Pt to ED via POV c/o lower back pain, Abd pain, and nausea. Pt states that he had a CT on Wednesday, was diagnosed with diverticulitis, kidney stones in the left kidney, and possible mass in his colon. Pt is currently on Cipro and Flagyl. Pt reports increased pain and nausea that woke him up this morning. Pt states that he did drink 1 beer last night. Pt is in NAD.

## 2019-02-25 NOTE — Discharge Instructions (Signed)
Continue taking the antibiotics and finish the full course.  You may use the Zofran prescribed today as needed for nausea, and the pain medication that you are already prescribed.  Return to the ER for new, worsening, or persistent severe abdominal or back pain, fever, vomiting, blood in the stool, or any other new or worsening symptoms that concern you.  Follow-up with your doctor tomorrow as scheduled.

## 2019-02-25 NOTE — ED Notes (Signed)
MD in room to assess patient.  Will continue to monitor.   

## 2019-02-25 NOTE — ED Provider Notes (Signed)
[facility] Emergency Department Provider Note ____________________________________________   First MD Initiated Contact with Patient 02/25/19 [phone]     (approximate)  I have reviewed the triage vital signs and the nursing notes.   HISTORY  Chief Complaint Abdominal Pain and Back Pain    HPI Raymond Hamilton is a 48 y.o. male with PMH as noted below who presents with primarily right-sided lower abdominal and back pain, acute onset early this morning, and associated with nausea and an episode of vomiting.  The patient has also had some diarrhea this morning.  He states that his urine has been dark but denies any acute urinary symptoms.  The patient states that several days ago he had developed some left-sided abdominal pain and was seen by his doctor.  He had a CT showing kidney stones as well as possible diverticulitis, and was started on Cipro and Flagyl.  Past Medical History:  Diagnosis Date  . GERD (gastroesophageal reflux disease)   . HLD (hyperlipidemia)   . Neck pain     Patient Active Problem List   Diagnosis Date Noted  . Dysphagia   . Stricture and stenosis of esophagus   . Smoker 03/01/2017  . Chest pain 02/16/2017  . GERD (gastroesophageal reflux disease) 02/16/2017  . Chronic neck pain 06/10/2014  . History of peptic ulcer disease 06/10/2014  . Hyperlipidemia 06/10/2014    Past Surgical History:  Procedure Laterality Date  . ESOPHAGOGASTRODUODENOSCOPY (EGD) WITH PROPOFOL N/A 09/19/2018   Procedure: ESOPHAGOGASTRODUODENOSCOPY (EGD) WITH PROPOFOL;  Surgeon: Midge Minium, MD;  Location: [facility] ENDOSCOPY;  Service: Endoscopy;  Laterality: N/A;  . TUMOR EXCISION      Prior to Admission medications   Medication Sig Start Date End Date Taking? Authorizing Provider  gabapentin (NEURONTIN) 100 MG capsule TAKE 1 CAPSULE (100 MG TOTAL) BY MOUTH 2 (TWO) TIMES DAILY. 12/22/16   [provider]  HYDROcodone-acetaminophen (NORCO) 5-325 MG  tablet Take 1 tablet by mouth every 4 (four) hours as needed for moderate pain. Patient not taking: Reported on 07/05/2018 05/08/18   Willy Eddy, MD  omeprazole (PRILOSEC) 40 MG capsule Take 1 capsule 2 (two) times daily by mouth. 10/01/16 10/01/17  [provider]  omeprazole (PRILOSEC) 40 MG capsule Take by mouth daily. 05/12/18   [provider]  ondansetron (ZOFRAN ODT) 8 MG disintegrating tablet Take 1 tablet (8 mg total) by mouth every 8 (eight) hours as needed. 02/25/19   Dionne Bucy, MD  oxyCODONE-acetaminophen (PERCOCET) 5-325 MG tablet Take 1 tablet by mouth every 4 (four) hours as needed. 10/27/18   Nita Sickle, MD  silver sulfADIAZINE (SILVADENE) 1 % cream Apply to affected area daily 10/27/18 10/27/19  Nita Sickle, MD    Allergies Patient has no known allergies.  Family History  Problem Relation Age of Onset  . Hypertension Mother   . Diabetes Father     Social History Social History   Tobacco Use  . Smoking status: Current Every Day Smoker    Packs/day: 1.00    Types: Cigarettes  . Smokeless tobacco: Never Used  Substance Use Topics  . Alcohol use: Yes  . Drug use: Never    Review of Systems  Constitutional: No fever. Eyes: No redness. ENT: No sore throat. Cardiovascular: Denies chest pain. Respiratory: Denies shortness of breath. Gastrointestinal: Positive for vomiting and diarrhea. Genitourinary: Negative for dysuria.  Musculoskeletal: Positive for back pain. Skin: Negative for rash. Neurological: Negative for headache.   ____________________________________________   PHYSICAL EXAM:  VITAL  SIGNS: ED Triage Vitals  Enc Vitals Group     BP 02/25/19 0754 (!) 145/83     Pulse Rate 02/25/19 0754 89     Resp 02/25/19 0754 16     Temp 02/25/19 0754 98.6 F (37 C)     Temp Source 02/25/19 0754 Oral     SpO2 02/25/19 0754 97 %     Weight 02/25/19 0756 189 lb (85.7 kg)     Height 02/25/19 0756 6\' 1"  (1.854 m)      Head Circumference --      Peak Flow --      Pain Score 02/25/19 0755 7     Pain Loc --      Pain Edu? --      Excl. in [place]? --     Constitutional: Alert and oriented.  Relatively well appearing and in no acute distress. Eyes: Conjunctivae are normal.  No scleral icterus. Head: Atraumatic. Nose: No congestion/rhinnorhea. Mouth/Throat: Mucous membranes are moist.   Neck: Normal range of motion.  Cardiovascular: Normal rate, regular rhythm. Good peripheral circulation. Respiratory: Normal respiratory effort.  No retractions. Gastrointestinal: Soft with mild epigastric and right lower quadrant tenderness.  No distention.  Genitourinary: No CVA tenderness. Musculoskeletal: No lower extremity edema.  Extremities warm and well perfused.  Mild right lumbar paraspinal tenderness. Neurologic:  Normal speech and language. No gross focal neurologic deficits are appreciated.  Skin:  Skin is warm and dry. No rash noted. Psychiatric: Mood and affect are normal. Speech and behavior are normal.  ____________________________________________   LABS (all labs ordered are listed, but only abnormal results are displayed)  Labs Reviewed  COMPREHENSIVE METABOLIC PANEL - Abnormal; Notable for the following components:      Result Value   Glucose, Bld 129 (*)    All other components within normal limits  CBC - Abnormal; Notable for the following components:   MCH 34.3 (*)    All other components within normal limits  URINALYSIS, COMPLETE (UACMP) WITH MICROSCOPIC - Abnormal; Notable for the following components:   Color, Urine STRAW (*)    APPearance CLEAR (*)    Specific Gravity, Urine 1.003 (*)    All other components within normal limits  LIPASE, BLOOD   ____________________________________________  EKG  ED ECG REPORT I, Dionne BucySebastian Gibson Lad, the attending physician, personally viewed and interpreted this ECG.  Date: 02/25/2019 EKG Time: 0801 Rate: 85 Rhythm: normal sinus rhythm QRS Axis:  normal Intervals: normal ST/T Wave abnormalities: normal Narrative Interpretation: no evidence of acute ischemia  ____________________________________________  RADIOLOGY  CT: Diverticulosis with no evidence of diverticulitis.  Punctate nonobstructing left renal stones.  Normal appendix.  ____________________________________________   PROCEDURES  Procedure(s) performed: No  Procedures  Critical Care performed: No ____________________________________________   INITIAL IMPRESSION / ASSESSMENT AND PLAN / ED COURSE  Pertinent labs & imaging results that were available during my care of the patient were reviewed by me and considered in my medical decision making (see chart for details).  48 year old male with history of GERD and hyperlipidemia presents with acute onset of right sided abdominal and back pain this morning associated with vomiting and diarrhea.  The patient was diagnosed with diverticulitis and kidney stone several days ago and has been taking Cipro and Flagyl.  He reports drinking 1 beer last night.  I reviewed the past medical records in Epic and Care Everywhere.  Although I am unable to directly read the CT report, the PMDs note from 02/21/2019 reports that the noncontrast CT  showed thickening of the bladder wall, nonobstructing left kidney stone, and suspicion for sigmoid diverticulitis.  On exam, the patient is well-appearing.  His vital signs are normal except for mild hypertension.  He has right lower back and mild right lower quadrant abdominal tenderness.  The left side of the abdomen is minimally tender.  The remainder of the exam is as described above.  Overall I suspect most likely symptoms related to the patient's diagnoses from earlier in the week, particularly continued diverticulitis or recurrent stone, although the current pain is on the other side.  It is also possible that he is having some abdominal cramping or reaction related to the antibiotics, or an  unrelated gastroenteritis or gastritis.  We will give analgesia and antiemetic, obtain labs and a urinalysis, and reassess.  ----------------------------------------- 12:22 PM on 02/25/2019 -----------------------------------------  The lab work-up was unremarkable.  Because of the patient's tenderness and the fact that the initial study was noncontrast, after further discussion with the patient we obtained a contrast CT.  It is negative for acute findings.  I suspect that the patient's diverticulitis is resolving with antibiotics.  Etiology of the back pain is muscular pain versus possible radiating pain from the resolving diverticulitis.  At this time, the patient is stable for discharge.  Return precautions given, and he expresses understanding.  He is following up with his doctor tomorrow.  ____________________________________________   FINAL CLINICAL IMPRESSION(S) / ED DIAGNOSES  Final diagnoses:  Acute right-sided low back pain without sciatica  Non-intractable vomiting with nausea, unspecified vomiting type      NEW MEDICATIONS STARTED DURING THIS VISIT:  New Prescriptions   ONDANSETRON (ZOFRAN ODT) 8 MG DISINTEGRATING TABLET    Take 1 tablet (8 mg total) by mouth every 8 (eight) hours as needed.     Note:  This document was prepared using Dragon voice recognition software and may include unintentional dictation errors.    Arta Silence, MD 02/25/19 [phone]

## 2019-04-27 ENCOUNTER — Other Ambulatory Visit: Payer: Self-pay

## 2019-04-27 ENCOUNTER — Emergency Department
Admission: EM | Admit: 2019-04-27 | Discharge: 2019-04-27 | Disposition: A | Discharge status: Home / Self Care | Payer: BC Managed Care – PPO | Attending: Emergency Medicine | Admitting: Emergency Medicine

## 2019-04-27 ENCOUNTER — Emergency Department: Payer: BC Managed Care – PPO

## 2019-04-27 ENCOUNTER — Encounter: Payer: Self-pay | Admitting: Radiology

## 2019-04-27 DIAGNOSIS — Z79899 Other long term (current) drug therapy: Secondary | ICD-10-CM | POA: Insufficient documentation

## 2019-04-27 DIAGNOSIS — U071 COVID-19: Secondary | ICD-10-CM | POA: Diagnosis not present

## 2019-04-27 DIAGNOSIS — F17210 Nicotine dependence, cigarettes, uncomplicated: Secondary | ICD-10-CM | POA: Diagnosis not present

## 2019-04-27 DIAGNOSIS — R0789 Other chest pain: Secondary | ICD-10-CM | POA: Diagnosis present

## 2019-04-27 DIAGNOSIS — M5412 Radiculopathy, cervical region: Secondary | ICD-10-CM | POA: Insufficient documentation

## 2019-04-27 DIAGNOSIS — R091 Pleurisy: Secondary | ICD-10-CM | POA: Diagnosis not present

## 2019-04-27 LAB — CBC
HCT: 47 % (ref 39.0–52.0)
Hemoglobin: 16.8 g/dL (ref 13.0–17.0)
MCH: 34 pg (ref 26.0–34.0)
MCHC: 35.7 g/dL (ref 30.0–36.0)
MCV: 95.1 fL (ref 80.0–100.0)
Platelets: 151 10*3/uL (ref 150–400)
RBC: 4.94 MIL/uL (ref 4.22–5.81)
RDW: 12.1 % (ref 11.5–15.5)
WBC: 7 10*3/uL (ref 4.0–10.5)
nRBC: 0 % (ref 0.0–0.2)

## 2019-04-27 LAB — BASIC METABOLIC PANEL
Anion gap: 10 (ref 5–15)
BUN: 9 mg/dL (ref 6–20)
CO2: 26 mmol/L (ref 22–32)
Calcium: 9.4 mg/dL (ref 8.9–10.3)
Chloride: 102 mmol/L (ref 98–111)
Creatinine, Ser: 1.05 mg/dL (ref 0.61–1.24)
GFR calc Af Amer: >60 mL/min (ref >60)
GFR calc non Af Amer: >60 mL/min (ref >60)
Glucose, Bld: 144 mg/dL — ABNORMAL HIGH (ref 70–99)
Potassium: 3.7 mmol/L (ref 3.5–5.1)
Sodium: 138 mmol/L (ref 135–145)

## 2019-04-27 LAB — TROPONIN I (HIGH SENSITIVITY)
Troponin I (High Sensitivity): <2 ng/L (ref <18)
Troponin I (High Sensitivity): <2 ng/L (ref <18)

## 2019-04-27 MED ORDER — HYDROCODONE-ACETAMINOPHEN 5-325 MG PO TABS: 1.0000 | ORAL_TABLET | Freq: Four times a day (QID) | ORAL | 0 refills | Status: DC | PRN

## 2019-04-27 MED ORDER — PREDNISONE 20 MG PO TABS: 40.0000 mg | ORAL_TABLET | Freq: Every day | ORAL | 0 refills | Status: AC

## 2019-04-27 MED ORDER — KETOROLAC TROMETHAMINE 30 MG/ML IJ SOLN
15.0000 mg | Freq: Once | INTRAMUSCULAR | Status: AC
  Administered 2019-04-27: 21:00:00 15 mg via INTRAVENOUS
  Filled 2019-04-27: qty 1

## 2019-04-27 MED ORDER — DEXAMETHASONE SODIUM PHOSPHATE 10 MG/ML IJ SOLN
10.0000 mg | Freq: Once | INTRAMUSCULAR | Status: AC
  Administered 2019-04-27: 21:00:00 10 mg via INTRAVENOUS
  Filled 2019-04-27: qty 1

## 2019-04-27 MED ORDER — SODIUM CHLORIDE 0.9 % IV BOLUS
1000.0000 mL | Freq: Once | INTRAVENOUS | Status: AC
  Administered 2019-04-27: 1000 mL via INTRAVENOUS

## 2019-04-27 MED ORDER — IOHEXOL 350 MG/ML SOLN
100.0000 mL | Freq: Once | INTRAVENOUS | Status: AC | PRN
  Administered 2019-04-27: 100 mL via INTRAVENOUS

## 2019-04-27 NOTE — ED Provider Notes (Signed)
[facility] Emergency Department Provider Note  ____________________________________________   First MD Initiated Contact with Patient 04/27/19 2026     (approximate)  I have reviewed the triage vital signs and the nursing notes.   HISTORY  Chief Complaint Chest Pain    HPI Raymond Hamilton is a 49 y.o. male  Here with CP. Pt was recently diagnoses with COVID. He reports that he is approx 7-10 days into illness course. Over past 2 days or so, he's developed worsening sharp, positional chest pain along L chest worse w/ inspiration. He has also noticed some paresthesias along his left ulnar nerve distribution with mild neck pain. +cough. Fevers are resolved. No SOB at rest or w/ exertion. No other complaints. Pain mildly worse w/ breathing, no change w/ exertion. No h/o DVT/PE        Past Medical History:  Diagnosis Date  . GERD (gastroesophageal reflux disease)   . HLD (hyperlipidemia)   . Neck pain     Patient Active Problem List   Diagnosis Date Noted  . Dysphagia   . Stricture and stenosis of esophagus   . Smoker 03/01/2017  . Chest pain 02/16/2017  . GERD (gastroesophageal reflux disease) 02/16/2017  . Chronic neck pain 06/10/2014  . History of peptic ulcer disease 06/10/2014  . Hyperlipidemia 06/10/2014    Past Surgical History:  Procedure Laterality Date  . ESOPHAGOGASTRODUODENOSCOPY (EGD) WITH PROPOFOL N/A 09/19/2018   Procedure: ESOPHAGOGASTRODUODENOSCOPY (EGD) WITH PROPOFOL;  Surgeon: Midge Minium, MD;  Location: [facility] ENDOSCOPY;  Service: Endoscopy;  Laterality: N/A;  . TUMOR EXCISION      Prior to Admission medications   Medication Sig Start Date End Date Taking? Authorizing Provider  gabapentin (NEURONTIN) 100 MG capsule TAKE 1 CAPSULE (100 MG TOTAL) BY MOUTH 2 (TWO) TIMES DAILY. 12/22/16   [provider]  HYDROcodone-acetaminophen (NORCO/VICODIN) 5-325 MG tablet Take 1-2 tablets by mouth every 6 (six) hours as needed for  moderate pain. 04/27/19 04/26/20  Shaune Pollack, MD  omeprazole (PRILOSEC) 40 MG capsule Take 1 capsule 2 (two) times daily by mouth. 10/01/16 10/01/17  [provider]  omeprazole (PRILOSEC) 40 MG capsule Take by mouth daily. 05/12/18   [provider]  ondansetron (ZOFRAN ODT) 8 MG disintegrating tablet Take 1 tablet (8 mg total) by mouth every 8 (eight) hours as needed. 02/25/19   Dionne Bucy, MD  oxyCODONE-acetaminophen (PERCOCET) 5-325 MG tablet Take 1 tablet by mouth every 4 (four) hours as needed. 10/27/18   Nita Sickle, MD  predniSONE (DELTASONE) 20 MG tablet Take 2 tablets (40 mg total) by mouth daily for 5 days. 04/27/19 05/02/19  Shaune Pollack, MD  silver sulfADIAZINE (SILVADENE) 1 % cream Apply to affected area daily 10/27/18 10/27/19  Nita Sickle, MD    Allergies Patient has no known allergies.  Family History  Problem Relation Age of Onset  . Hypertension Mother   . Diabetes Father     Social History Social History   Tobacco Use  . Smoking status: Current Every Day Smoker    Packs/day: 1.00    Types: Cigarettes  . Smokeless tobacco: Never Used  Substance Use Topics  . Alcohol use: Yes  . Drug use: Never    Review of Systems  Review of Systems  Constitutional: Positive for chills and fatigue. Negative for fever.  HENT: Negative for sore throat.   Respiratory: Positive for chest tightness. Negative for shortness of breath.   Cardiovascular: Positive for chest pain.  Gastrointestinal: Negative for abdominal  pain.  Genitourinary: Negative for flank pain.  Musculoskeletal: Negative for neck pain.  Skin: Negative for rash and wound.  Allergic/Immunologic: Negative for immunocompromised state.  Neurological: Positive for numbness. Negative for weakness.  Hematological: Does not bruise/bleed easily.  All other systems reviewed and are negative.    ____________________________________________  PHYSICAL EXAM:      VITAL  SIGNS: ED Triage Vitals  Enc Vitals Group     BP 04/27/19 1829 (!) 157/90     Pulse Rate 04/27/19 1829 89     Resp 04/27/19 1829 16     Temp 04/27/19 1829 98.2 F (36.8 C)     Temp Source 04/27/19 1829 Oral     SpO2 04/27/19 1829 98 %     Weight 04/27/19 1830 191 lb (86.6 kg)     Height 04/27/19 1830 6\' 1"  (1.854 m)     Head Circumference --      Peak Flow --      Pain Score 04/27/19 1829 5     Pain Loc --      Pain Edu? --      Excl. in [place]? --      Physical Exam Vitals and nursing note reviewed.  Constitutional:      General: He is not in acute distress.    Appearance: He is well-developed.  HENT:     Head: Normocephalic and atraumatic.  Eyes:     Conjunctiva/sclera: Conjunctivae normal.  Neck:     Comments: Moderate L paraspinal TTP. +Spurling's on left with reproduction of paresthesias. Cardiovascular:     Rate and Rhythm: Normal rate and regular rhythm.     Heart sounds: Normal heart sounds. No murmur. No friction rub.  Pulmonary:     Effort: Pulmonary effort is normal. No respiratory distress.     Breath sounds: Normal breath sounds. No wheezing or rales.  Chest:     Comments: Mild TTP over anterior chest wall Abdominal:     General: There is no distension.     Palpations: Abdomen is soft.     Tenderness: There is no abdominal tenderness.  Musculoskeletal:     Cervical back: Neck supple.  Skin:    General: Skin is warm.     Capillary Refill: Capillary refill takes less than 2 seconds.  Neurological:     Mental Status: He is alert and oriented to person, place, and time.     Motor: No abnormal muscle tone.     Comments: Reported paresthesias along left ulnar arm, forearm and fourth/fifth digits. Strength 5/5 distally b/l UE includnig grip strength.       ____________________________________________   LABS (all labs ordered are listed, but only abnormal results are displayed)  Labs Reviewed  BASIC METABOLIC PANEL - Abnormal; Notable for the following  components:      Result Value   Glucose, Bld 144 (*)    All other components within normal limits  CBC  TROPONIN I (HIGH SENSITIVITY)  TROPONIN I (HIGH SENSITIVITY)    ____________________________________________  EKG: Normal sinus rhythm, VR 82. PR 168, QRS 100, QTc 441. No acute ST elevations or depressions. ________________________________________  RADIOLOGY All imaging, including plain films, CT scans, and ultrasounds, independently reviewed by me, and interpretations confirmed via formal radiology reads.  ED MD interpretation:   CXR: Clear CT Angio; Neg for PE  Official radiology report(s): DG Chest 2 View  Result Date: 04/27/2019 CLINICAL DATA:  Chest pain EXAM: CHEST - 2 VIEW COMPARISON:  08/13/2018 FINDINGS: The heart  size and mediastinal contours are within normal limits. Both lungs are clear. The visualized skeletal structures are unremarkable. IMPRESSION: No active cardiopulmonary disease. Electronically Signed   By: Jasmine Pang M.D.   On: 04/27/2019 19:01   CT Angio Chest PE W and/or Wo Contrast  Result Date: 04/27/2019 CLINICAL DATA:  Shortness of breath. COVID positive. Chest tightness. EXAM: CT ANGIOGRAPHY CHEST WITH CONTRAST TECHNIQUE: Multidetector CT imaging of the chest was performed using the standard protocol during bolus administration of intravenous contrast. Multiplanar CT image reconstructions and MIPs were obtained to evaluate the vascular anatomy. CONTRAST:  OMNIPAQUE IOHEXOL 350 MG/ML SOLN COMPARISON:  Radiograph earlier this day. Chest CTA 02/16/2017 FINDINGS: Cardiovascular: There are no filling defects within the pulmonary arteries to suggest pulmonary embolus. Thoracic aorta is normal in caliber. No evidence of aortic dissection, evaluation tailored to aortic evaluation. Minimal aortic atherosclerosis. Normal heart size. No pericardial effusion. Mediastinum/Nodes: No enlarged mediastinal or hilar lymph nodes. Minimal chronic distal esophageal wall  thickening. No visualized thyroid nodule. Lungs/Pleura: Mild emphysema. No acute or focal airspace disease. Triangular perifissural opacity in the left upper lobe is unchanged from prior exam may represent scarring or intrapulmonary lymph node. Upper Abdomen: No acute findings. Musculoskeletal: There are no acute or suspicious osseous abnormalities. Review of the MIP images confirms the above findings. IMPRESSION: No pulmonary embolus or acute intrathoracic abnormality. Emphysema (ICD10-J43.9). Electronically Signed   By: Narda Rutherford M.D.   On: 04/27/2019 22:11    ____________________________________________  PROCEDURES   Procedure(s) performed (including Critical Care):  Procedures  ____________________________________________  INITIAL IMPRESSION / MDM / ASSESSMENT AND PLAN / ED COURSE  As part of my medical decision making, I reviewed the following data within the electronic MEDICAL RECORD NUMBER Nursing notes reviewed and incorporated, Old chart reviewed, Notes from prior ED visits, and [place] Controlled Substance Database       *Merril Nagy Read was evaluated in Emergency Department on 04/28/2019 for the symptoms described in the history of present illness. He was evaluated in the context of the global COVID-19 pandemic, which necessitated consideration that the patient might be at risk for infection with the SARS-CoV-2 virus that causes COVID-19. Institutional protocols and algorithms that pertain to the evaluation of patients at risk for COVID-19 are in a state of rapid change based on information released by regulatory bodies including the CDC and federal and state organizations. These policies and algorithms were followed during the patient's care in the ED.  Some ED evaluations and interventions may be delayed as a result of limited staffing during the pandemic.*     Medical Decision Making:   Very pleasant 49 yo M here with atypical CP in setting of ongoing COVID-19. EKG nonischemic and  trop neg x 2 - doubt ACS. CT Angio ned without PE, PTX, or PNA. He is satting well on RA. Labs o/w reassuring. No abd pain or TTP. D/c with supportive care for MSK pain vs pleurisy 2/2 COVID-19. Re: his paresthesias, he has known cervical radiculopathy which I suspect is exacerbated from his coughing. No signs of neurological compromise. Distal NVI.  ____________________________________________  FINAL CLINICAL IMPRESSION(S) / ED DIAGNOSES  Final diagnoses:  COVID-19  Cervical radiculopathy  Pleurisy     MEDICATIONS GIVEN DURING THIS VISIT:  Medications  ketorolac (TORADOL) 30 MG/ML injection 15 mg (15 mg Intravenous Given 04/27/19 2127)  dexamethasone (DECADRON) injection 10 mg (10 mg Intravenous Given 04/27/19 2127)  sodium chloride 0.9 % bolus 1,000 mL (0 mLs Intravenous Stopped 04/27/19  2317)  iohexol (OMNIPAQUE) 350 MG/ML injection 100 mL (100 mLs Intravenous Contrast Given 04/27/19 2158)     ED Discharge Orders         Ordered    predniSONE (DELTASONE) 20 MG tablet  Daily     04/27/19 2238    HYDROcodone-acetaminophen (NORCO/VICODIN) 5-325 MG tablet  Every 6 hours PRN     04/27/19 2238           Note:  This document was prepared using Dragon voice recognition software and may include unintentional dictation errors.   Shaune Pollack, MD 04/28/19 [phone]

## 2019-04-27 NOTE — ED Triage Notes (Addendum)
Pt reports that he has tested + for covid, states that he was symptomatic last week with headache and bodyaches, sore throat, but tested negative, re-tested when his wife started with fever and is now positive, pt states that he started having chest pain today and it has been intermittent, states some tingling in his left arm and fingers, no distress noted at this time

## 2019-04-27 NOTE — ED Triage Notes (Signed)
FIRST NURSE NOTE- pt is covid + and c/o tightness around chest and difficulty breathing.

## 2019-06-07 ENCOUNTER — Ambulatory Visit
Admission: RE | Admit: 2019-06-07 | Discharge: 2019-06-07 | Disposition: A | Discharge status: Home / Self Care | Payer: BC Managed Care – PPO | Source: Ambulatory Visit | Attending: Family Medicine | Admitting: Family Medicine

## 2019-06-07 ENCOUNTER — Other Ambulatory Visit: Payer: Self-pay | Admitting: Family Medicine

## 2019-06-07 ENCOUNTER — Other Ambulatory Visit (HOSPITAL_COMMUNITY): Payer: Self-pay | Admitting: Family Medicine

## 2019-06-07 ENCOUNTER — Other Ambulatory Visit: Payer: Self-pay

## 2019-06-07 DIAGNOSIS — R1013 Epigastric pain: Secondary | ICD-10-CM

## 2019-06-07 DIAGNOSIS — R630 Anorexia: Secondary | ICD-10-CM | POA: Diagnosis present

## 2019-06-07 MED ORDER — IOHEXOL 300 MG/ML  SOLN
100.0000 mL | Freq: Once | INTRAMUSCULAR | Status: AC | PRN
  Administered 2019-06-07: 100 mL via INTRAVENOUS

## 2019-11-21 ENCOUNTER — Other Ambulatory Visit: Payer: Self-pay

## 2019-11-21 ENCOUNTER — Emergency Department
Admission: EM | Admit: 2019-11-21 | Discharge: 2019-11-21 | Disposition: A | Discharge status: Home / Self Care | Payer: BC Managed Care – PPO | Attending: Emergency Medicine | Admitting: Emergency Medicine

## 2019-11-21 ENCOUNTER — Encounter: Payer: Self-pay | Admitting: Emergency Medicine

## 2019-11-21 DIAGNOSIS — F17210 Nicotine dependence, cigarettes, uncomplicated: Secondary | ICD-10-CM | POA: Insufficient documentation

## 2019-11-21 DIAGNOSIS — J029 Acute pharyngitis, unspecified: Secondary | ICD-10-CM | POA: Diagnosis present

## 2019-11-21 DIAGNOSIS — R030 Elevated blood-pressure reading, without diagnosis of hypertension: Secondary | ICD-10-CM | POA: Diagnosis not present

## 2019-11-21 LAB — CBC WITH DIFFERENTIAL/PLATELET
Abs Immature Granulocytes: 0.03 10*3/uL (ref 0.00–0.07)
Basophils Absolute: 0.1 10*3/uL (ref 0.0–0.1)
Basophils Relative: 1 %
Eosinophils Absolute: 0.2 10*3/uL (ref 0.0–0.5)
Eosinophils Relative: 2 %
HCT: 46.9 % (ref 39.0–52.0)
Hemoglobin: 16.7 g/dL (ref 13.0–17.0)
Immature Granulocytes: 0 %
Lymphocytes Relative: 37 %
Lymphs Abs: 3.8 10*3/uL (ref 0.7–4.0)
MCH: 35.7 pg — ABNORMAL HIGH (ref 26.0–34.0)
MCHC: 35.6 g/dL (ref 30.0–36.0)
MCV: 100.2 fL — ABNORMAL HIGH (ref 80.0–100.0)
Monocytes Absolute: 0.7 10*3/uL (ref 0.1–1.0)
Monocytes Relative: 7 %
Neutro Abs: 5.3 10*3/uL (ref 1.7–7.7)
Neutrophils Relative %: 53 %
Platelets: 128 10*3/uL — ABNORMAL LOW (ref 150–400)
RBC: 4.68 MIL/uL (ref 4.22–5.81)
RDW: 13 % (ref 11.5–15.5)
WBC: 10.2 10*3/uL (ref 4.0–10.5)
nRBC: 0 % (ref 0.0–0.2)

## 2019-11-21 LAB — GROUP A STREP BY PCR: Group A Strep by PCR: NOT DETECTED

## 2019-11-21 MED ORDER — AMOXICILLIN 875 MG PO TABS: 875.0000 mg | ORAL_TABLET | Freq: Two times a day (BID) | ORAL | 0 refills | Status: DC

## 2019-11-21 MED ORDER — PREDNISONE 20 MG PO TABS: ORAL_TABLET | ORAL | 0 refills | Status: DC

## 2019-11-21 NOTE — ED Notes (Signed)
See triage note  Presents with sore throat  States he developed sore throat 2 weeks ago  Was seen by PCP had COVID test which was negative  But conts to have sore throat  No fever

## 2019-11-21 NOTE — ED Triage Notes (Signed)
Patient ambulatory to triage with steady gait, without difficulty or distress noted; pt reports last 2 wks has had sensation of "throat closing"; denies any recent illness; st seen PCP yesterday and had COVID test with results pending; back of throat reddened; per chart notes pt dx with pharyngitis and rx augmentin but next p/u med

## 2019-11-21 NOTE — Discharge Instructions (Signed)
Follow-up with your primary care provider if any continued problems or continued concerns.  Begin taking amoxicillin 875 twice daily for 10 days and prednisone 20 mg daily for the next 5 days.  You may take Tylenol with this medication if needed for throat pain.  Increase fluids.  Also your blood pressure while in the emergency department was mildly elevated.  Have this rechecked.  Covid test at [facility] was negative and strep test in the ED was negative.

## 2019-11-21 NOTE — ED Provider Notes (Signed)
[facility] Emergency Department Provider Note   ____________________________________________   None    (approximate)  I have reviewed the triage vital signs and the nursing notes.   HISTORY  Chief Complaint Sore Throat   HPI Raymond Hamilton is a 49 y.o. male presents to the ED with complaint of sore throat that he developed approximately 2 weeks ago. Patient states he was seen by his PCP and had a Covid test done which was negative. He continues to have sore throat but denies fever, chills, nausea or vomiting.      Past Medical History:  Diagnosis Date  . GERD (gastroesophageal reflux disease)   . HLD (hyperlipidemia)   . Neck pain     Patient Active Problem List   Diagnosis Date Noted  . Dysphagia   . Stricture and stenosis of esophagus   . Smoker 03/01/2017  . Chest pain 02/16/2017  . GERD (gastroesophageal reflux disease) 02/16/2017  . Chronic neck pain 06/10/2014  . History of peptic ulcer disease 06/10/2014  . Hyperlipidemia 06/10/2014    Past Surgical History:  Procedure Laterality Date  . ESOPHAGOGASTRODUODENOSCOPY (EGD) WITH PROPOFOL N/A 09/19/2018   Procedure: ESOPHAGOGASTRODUODENOSCOPY (EGD) WITH PROPOFOL;  Surgeon: Midge Minium, MD;  Location: [facility] ENDOSCOPY;  Service: Endoscopy;  Laterality: N/A;  . TUMOR EXCISION      Prior to Admission medications   Medication Sig Start Date End Date Taking? Authorizing Provider  losartan (COZAAR) 25 MG tablet Take 25 mg by mouth daily.   Yes [provider]  amoxicillin (AMOXIL) 875 MG tablet Take 1 tablet (875 mg total) by mouth 2 (two) times daily. 11/21/19   Tommi Rumps, PA-C  gabapentin (NEURONTIN) 100 MG capsule TAKE 1 CAPSULE (100 MG TOTAL) BY MOUTH 2 (TWO) TIMES DAILY. 12/22/16   [provider]  omeprazole (PRILOSEC) 40 MG capsule Take 1 capsule 2 (two) times daily by mouth. 10/01/16 10/01/17  [provider]  omeprazole (PRILOSEC) 40 MG capsule Take by  mouth daily. 05/12/18   [provider]  ondansetron (ZOFRAN ODT) 8 MG disintegrating tablet Take 1 tablet (8 mg total) by mouth every 8 (eight) hours as needed. 02/25/19   Dionne Bucy, MD  predniSONE (DELTASONE) 20 MG tablet Take 1 tablet once a day for 5 days 11/21/19   Tommi Rumps, PA-C    Allergies Patient has no known allergies.  Family History  Problem Relation Age of Onset  . Hypertension Mother   . Diabetes Father     Social History Social History   Tobacco Use  . Smoking status: Current Every Day Smoker    Packs/day: 1.00    Types: Cigarettes  . Smokeless tobacco: Never Used  Vaping Use  . Vaping Use: Never used  Substance Use Topics  . Alcohol use: Yes  . Drug use: Never    Review of Systems Constitutional: No fever/chills Eyes: No visual changes. ENT: Positive for sore throat. Cardiovascular: Denies chest pain. Respiratory: Denies shortness of breath. Gastrointestinal: No abdominal pain.  No nausea, no vomiting.  No diarrhea.   Musculoskeletal: Negative for muscle aches. Skin: Negative for rash. Neurological: Negative for headaches, focal weakness or numbness. ____________________________________________   PHYSICAL EXAM:  VITAL SIGNS: ED Triage Vitals  Enc Vitals Group     BP 11/21/19 0351 (!) 175/92     Pulse Rate 11/21/19 0351 (!) 121     Resp 11/21/19 0351 18     Temp 11/21/19 0351 98.3 F (36.8 C)  Temp Source 11/21/19 0351 Oral     SpO2 11/21/19 0351 98 %     Weight 11/21/19 0353 186 lb (84.4 kg)     Height 11/21/19 0353 6\' 1"  (1.854 m)     Head Circumference --      Peak Flow --      Pain Score 11/21/19 0401 (P) 9     Pain Loc --      Pain Edu? --      Excl. in GC? --     Constitutional: Alert and oriented. Well appearing and in no acute distress. Eyes: Conjunctivae are normal.  Head: Atraumatic. Nose: No congestion/rhinnorhea. Mouth/Throat: Mucous membranes are moist.  Oropharynx non-erythematous. Uvula is  midline. Mild posterior drainage noted. Neck: No stridor.   Hematological/Lymphatic/Immunilogical: No cervical lymphadenopathy. Cardiovascular: Normal rate, regular rhythm. Grossly normal heart sounds.  Good peripheral circulation. Respiratory: Normal respiratory effort.  No retractions. Lungs CTAB. Musculoskeletal: No lower extremity tenderness nor edema.  No joint effusions. Neurologic:  Normal speech and language. No gross focal neurologic deficits are appreciated. No gait instability. Skin:  Skin is warm, dry and intact. No rash noted. Psychiatric: Mood and affect are normal. Speech and behavior are normal.  ____________________________________________   LABS (all labs ordered are listed, but only abnormal results are displayed)  Labs Reviewed  CBC WITH DIFFERENTIAL/PLATELET - Abnormal; Notable for the following components:      Result Value   MCV 100.2 (*)    MCH 35.7 (*)    Platelets 128 (*)    All other components within normal limits  GROUP A STREP BY PCR    PROCEDURES  Procedure(s) performed (including Critical Care):  Procedures   ____________________________________________   INITIAL IMPRESSION / ASSESSMENT AND PLAN / ED COURSE  As part of my medical decision making, I reviewed the following data within the electronic MEDICAL RECORD NUMBER Notes from prior ED visits and [place] Controlled Substance Database  Kayton W Nazar was evaluated in Emergency Department on 11/21/2019 for the symptoms described in the history of present illness. He was evaluated in the context of the global COVID-19 pandemic, which necessitated consideration that the patient might be at risk for infection with the SARS-CoV-2 virus that causes COVID-19. Institutional protocols and algorithms that pertain to the evaluation of patients at risk for COVID-19 are in a state of rapid change based on information released by regulatory bodies including the CDC and federal and state organizations. These policies  and algorithms were followed during the patient's care in the ED.  49 year old male presents to the ED with complaint of sore throat for the last 2 weeks. Patient denies any fever or chills. He was seen by his PCP yesterday and the results of his Covid test are negative. Patient strep test in the ED is negative. Patient was given prescription for Amoxil 875 twice daily for 10 days and encouraged to drink fluids. A prescription for prednisone 20 mg 1 daily was sent to the pharmacy. Tylenol or ibuprofen as needed for throat pain. Patient is to follow-up with his PCP if any continued problems. ____________________________________________   FINAL CLINICAL IMPRESSION(S) / ED DIAGNOSES  Final diagnoses:  Pharyngitis, unspecified etiology  Elevated blood pressure reading     ED Discharge Orders         Ordered    amoxicillin (AMOXIL) 875 MG tablet  2 times daily     Discontinue  Reprint     11/21/19 0750    predniSONE (DELTASONE) 20 MG tablet  Discontinue  Reprint     11/21/19 0750           Note:  This document was prepared using Dragon voice recognition software and may include unintentional dictation errors.    Tommi Rumps, PA-C 11/21/19 1438    Arnaldo Natal, MD 11/21/19 1452

## 2020-04-05 HISTORY — PX: COLONOSCOPY W/ POLYPECTOMY: SHX1380

## 2020-04-26 ENCOUNTER — Emergency Department
Admission: EM | Admit: 2020-04-26 | Discharge: 2020-04-26 | Disposition: A | Discharge status: Home / Self Care | Payer: Self-pay | Attending: Emergency Medicine | Admitting: Emergency Medicine

## 2020-04-26 ENCOUNTER — Encounter: Payer: Self-pay | Admitting: Emergency Medicine

## 2020-04-26 ENCOUNTER — Emergency Department: Payer: Self-pay

## 2020-04-26 ENCOUNTER — Other Ambulatory Visit: Payer: Self-pay

## 2020-04-26 DIAGNOSIS — M25551 Pain in right hip: Secondary | ICD-10-CM

## 2020-04-26 DIAGNOSIS — W000XXA Fall on same level due to ice and snow, initial encounter: Secondary | ICD-10-CM | POA: Insufficient documentation

## 2020-04-26 DIAGNOSIS — S7001XA Contusion of right hip, initial encounter: Secondary | ICD-10-CM | POA: Insufficient documentation

## 2020-04-26 DIAGNOSIS — I10 Essential (primary) hypertension: Secondary | ICD-10-CM | POA: Insufficient documentation

## 2020-04-26 DIAGNOSIS — Z79899 Other long term (current) drug therapy: Secondary | ICD-10-CM | POA: Insufficient documentation

## 2020-04-26 DIAGNOSIS — F17210 Nicotine dependence, cigarettes, uncomplicated: Secondary | ICD-10-CM | POA: Insufficient documentation

## 2020-04-26 DIAGNOSIS — W19XXXA Unspecified fall, initial encounter: Secondary | ICD-10-CM

## 2020-04-26 HISTORY — DX: Essential (primary) hypertension: I10

## 2020-04-26 MED ORDER — OXYCODONE-ACETAMINOPHEN 5-325 MG PO TABS: 1.0000 | ORAL_TABLET | Freq: Four times a day (QID) | ORAL | 0 refills | Status: DC | PRN

## 2020-04-26 MED ORDER — IBUPROFEN 600 MG PO TABS
600.0000 mg | ORAL_TABLET | Freq: Once | ORAL | Status: AC
  Administered 2020-04-26: 600 mg via ORAL
  Filled 2020-04-26: qty 1

## 2020-04-26 MED ORDER — OXYCODONE-ACETAMINOPHEN 5-325 MG PO TABS
1.0000 | ORAL_TABLET | Freq: Once | ORAL | Status: AC
  Administered 2020-04-26: 1 via ORAL
  Filled 2020-04-26: qty 1

## 2020-04-26 MED ORDER — OXYCODONE-ACETAMINOPHEN 5-325 MG PO TABS
1.0000 | ORAL_TABLET | ORAL | Status: DC | PRN
  Administered 2020-04-26: 1 via ORAL
  Filled 2020-04-26: qty 1

## 2020-04-26 NOTE — ED Triage Notes (Signed)
Patient states that he slipped and fell on the ice. Patient with complaint of right hip pain.

## 2020-04-26 NOTE — ED Provider Notes (Signed)
[facility] Emergency Department Provider Note  ____________________________________________   Event Date/Time   First MD Initiated Contact with Patient 04/26/20 0406     (approximate)  I have reviewed the triage vital signs and the nursing notes.   HISTORY  Chief Complaint Hip Pain    HPI Raymond Hamilton is a 50 y.o. male with medical history as listed below but no prior orthopedic history who  presents after a fall on the ice and snow tonight.  He reports that he stumbled and fell, landing on the pavement on his right hip that radiates around to his backside.  He has no numbness no weakness.  Move around makes it worse and holding still makes a little bit better.        Past Medical History:  Diagnosis Date  . GERD (gastroesophageal reflux disease)   . HLD (hyperlipidemia)   . Hypertension   . Neck pain     Patient Active Problem List   Diagnosis Date Noted  . Dysphagia   . Stricture and stenosis of esophagus   . Smoker 03/01/2017  . Chest pain 02/16/2017  . GERD (gastroesophageal reflux disease) 02/16/2017  . Chronic neck pain 06/10/2014  . History of peptic ulcer disease 06/10/2014  . Hyperlipidemia 06/10/2014    Past Surgical History:  Procedure Laterality Date  . ESOPHAGOGASTRODUODENOSCOPY (EGD) WITH PROPOFOL N/A 09/19/2018   Procedure: ESOPHAGOGASTRODUODENOSCOPY (EGD) WITH PROPOFOL;  Surgeon: Midge Minium, MD;  Location: [facility] ENDOSCOPY;  Service: Endoscopy;  Laterality: N/A;  . TUMOR EXCISION      Prior to Admission medications   Medication Sig Start Date End Date Taking? Authorizing Provider  oxyCODONE-acetaminophen (PERCOCET) 5-325 MG tablet Take 1 tablet by mouth every 6 (six) hours as needed for severe pain. 04/26/20  Yes Loleta Rose, MD  amoxicillin (AMOXIL) 875 MG tablet Take 1 tablet (875 mg total) by mouth 2 (two) times daily. 11/21/19   Tommi Rumps, PA-C  gabapentin (NEURONTIN) 100 MG capsule TAKE 1 CAPSULE (100 MG  TOTAL) BY MOUTH 2 (TWO) TIMES DAILY. 12/22/16   [provider]  losartan (COZAAR) 25 MG tablet Take 25 mg by mouth daily.    [provider]  omeprazole (PRILOSEC) 40 MG capsule Take 1 capsule 2 (two) times daily by mouth. 10/01/16 10/01/17  [provider]  omeprazole (PRILOSEC) 40 MG capsule Take by mouth daily. 05/12/18   [provider]  ondansetron (ZOFRAN ODT) 8 MG disintegrating tablet Take 1 tablet (8 mg total) by mouth every 8 (eight) hours as needed. 02/25/19   Dionne Bucy, MD  predniSONE (DELTASONE) 20 MG tablet Take 1 tablet once a day for 5 days 11/21/19   Tommi Rumps, PA-C    Allergies Patient has no known allergies.  Family History  Problem Relation Age of Onset  . Hypertension Mother   . Diabetes Father     Social History Social History   Tobacco Use  . Smoking status: Current Every Day Smoker    Packs/day: 1.00    Types: Cigarettes  . Smokeless tobacco: Never Used  Vaping Use  . Vaping Use: Never used  Substance Use Topics  . Alcohol use: Yes  . Drug use: Never    Review of Systems Constitutional: No fever/chills Cardiovascular: Denies chest pain. Respiratory: Denies shortness of breath. Gastrointestinal: No abdominal pain.   Musculoskeletal: Right hip pain and pain in the buttocks. Neurological: Negative for headaches, focal weakness or numbness.   ____________________________________________   PHYSICAL EXAM:  VITAL SIGNS: ED Triage Vitals  Enc Vitals Group     BP 04/26/20 0035 (!) 150/73     Pulse Rate 04/26/20 0035 95     Resp 04/26/20 0035 18     Temp 04/26/20 0035 98.7 F (37.1 C)     Temp Source 04/26/20 0035 Oral     SpO2 04/26/20 0035 95 %     Weight 04/26/20 0036 84.8 kg (187 lb)     Height 04/26/20 0036 1.854 m (6\' 1" )     Head Circumference --      Peak Flow --      Pain Score 04/26/20 0036 10     Pain Loc --      Pain Edu? --      Excl. in GC? --     Constitutional: Alert and  oriented.  Eyes: Conjunctivae are normal.  Head: Atraumatic. Nose: No congestion/rhinnorhea. Cardiovascular: Normal rate, regular rhythm. Good peripheral circulation. Respiratory: Normal respiratory effort.  No retractions. Musculoskeletal: No lower extremity tenderness nor edema. No gross deformities of extremities.  He has some tenderness to palpation of the right lateral hip and proximal femur but no obvious contusion, ecchymosis, abrasion, etc.  Neurovascularly intact. Neurologic:  Normal speech and language. No gross focal neurologic deficits are appreciated.  Skin:  Skin is warm, dry and intact. Psychiatric: Mood and affect are normal. Speech and behavior are normal.  ____________________________________________   LABS (all labs ordered are listed, but only abnormal results are displayed)  Labs Reviewed - No data to display ____________________________________________  EKG  No indication for emergent EKG ____________________________________________  RADIOLOGY I, 04/28/20, personally viewed and evaluated these images (plain radiographs) as part of my medical decision making, as well as reviewing the written report by the radiologist.  ED MD interpretation: No acute abnormalities identified on plain films  Official radiology report(s): DG Sacrum/Coccyx  Result Date: 04/26/2020 CLINICAL DATA:  Right hip pain after slip and fall on ice. EXAM: SACRUM AND COCCYX - 2+ VIEW COMPARISON:  None. FINDINGS: Sacrum appears intact. No evidence of acute fracture or dislocation. Normal alignment. Bone cortex appears intact. Sacral struts are symmetrical. SI joints are not displaced. 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 IMPRESSION: No acute bony abnormalities. Spondylolysis with mild spondylolisthesis at L5-S1. Electronically Signed   By: 04/28/2020 M.D.   On: 04/26/2020 01:22   DG Hip Unilat  With Pelvis 2-3 Views Right  Result Date:  04/26/2020 CLINICAL DATA:  Right hip pain after slip and fall on ice. EXAM: DG HIP (WITH OR WITHOUT PELVIS) 2-3V RIGHT COMPARISON:  None. FINDINGS: Pelvis and right hip appear intact. No evidence of acute fracture or dislocation. No focal bone lesion or bone destruction. SI joints and symphysis pubis are nondisplaced. Soft tissues are unremarkable. IMPRESSION: No acute fracture or dislocation. Electronically Signed   By: 04/28/2020 M.D.   On: 04/26/2020 01:21    ____________________________________________   PROCEDURES   Procedure(s) performed (including Critical Care):  Procedures   ____________________________________________   INITIAL IMPRESSION / MDM / ASSESSMENT AND PLAN / ED COURSE  As part of my medical decision making, I reviewed the following data within the electronic MEDICAL RECORD NUMBER Nursing notes reviewed and incorporated, Old chart reviewed, Radiograph reviewed , Notes from prior ED visits and [place] Controlled Substance Database   Differential diagnosis includes, but is not limited to, fracture, dislocation, contusion.  I personally reviewed the patient's imaging and agree with the radiologist's interpretation that there is  no evidence of acute fracture or dislocation.  Physical exam is generally reassuring.  The patient is obviously uncomfortable due to the contusion but he has no bruising, abrasion, nor ecchymosis.  I had my usual customary musculoskeletal pain/contusion discussion.  He received a total of 2 Percocet in the ED, as well as ibuprofen 600 mg by mouth.  I gave my usual and customary return precautions and follow-up recommendations.           ____________________________________________  FINAL CLINICAL IMPRESSION(S) / ED DIAGNOSES  Final diagnoses:  Fall, initial encounter  Right hip pain  Contusion of right hip, initial encounter     MEDICATIONS GIVEN DURING THIS VISIT:  Medications  oxyCODONE-acetaminophen (PERCOCET/ROXICET) 5-325 MG per  tablet 1 tablet (1 tablet Oral Given 04/26/20 0041)  oxyCODONE-acetaminophen (PERCOCET/ROXICET) 5-325 MG per tablet 1 tablet (has no administration in time range)  ibuprofen (ADVIL) tablet 600 mg (has no administration in time range)     ED Discharge Orders         Ordered    oxyCODONE-acetaminophen (PERCOCET) 5-325 MG tablet  Every 6 hours PRN        04/26/20 0419          *Please note:  Raymond Hamilton was evaluated in Emergency Department on 04/26/2020 for the symptoms described in the history of present illness. He was evaluated in the context of the global COVID-19 pandemic, which necessitated consideration that the patient might be at risk for infection with the SARS-CoV-2 virus that causes COVID-19. Institutional protocols and algorithms that pertain to the evaluation of patients at risk for COVID-19 are in a state of rapid change based on information released by regulatory bodies including the CDC and federal and state organizations. These policies and algorithms were followed during the patient's care in the ED.  Some ED evaluations and interventions may be delayed as a result of limited staffing during and after the pandemic.*  Note:  This document was prepared using Dragon voice recognition software and may include unintentional dictation errors.   Loleta Rose, MD 04/26/20 [phone]

## 2020-04-26 NOTE — Discharge Instructions (Signed)
As we discussed, after a fall like you experienced, you can expect to be sore for quite a while even though you have no sign of a fracture (break of the bone) on your x-rays.  Please read through the included information for additional management recommendations.  We suggest you alternate doses of ibuprofen and Tylenol according to label instructions so that you take 1 or the other every 3 hours, and each 1 individually no more often than every 6 hours.  Take Percocet as prescribed for severe pain. Do not drink alcohol, drive or participate in any other potentially dangerous activities while taking this medication as it may make you sleepy. Do not take this medication with any other sedating medications, either prescription or over-the-counter. If you were prescribed Percocet or Vicodin, do not take these with acetaminophen (Tylenol) as it is already contained within these medications.   This medication is an opiate (or narcotic) pain medication and can be habit forming.  Use it as little as possible to achieve adequate pain control.  Do not use or use it with extreme caution if you have a history of opiate abuse or dependence.  If you are on a pain contract with your primary care doctor or a pain specialist, be sure to let them know you were prescribed this medication today from the [facility] Emergency Department.  This medication is intended for your use only - do not give any to anyone else and keep it in a secure place where nobody else, especially children, have access to it.  It will also cause or worsen constipation, so you may want to consider taking an over-the-counter stool softener while you are taking this medication.

## 2021-01-21 IMAGING — DX PORTABLE CHEST - 1 VIEW
1 series · 1 of 1 positions shown · non-contrast
Comparison: None.

CLINICAL DATA: Shortness of breath, cough, chest pressure. Pt
states that he feels like he has pneumonia. Current smoker.
SHORTNESS OF BREATH COUGH

EXAM:
PORTABLE CHEST 1 VIEW

[chest ap]
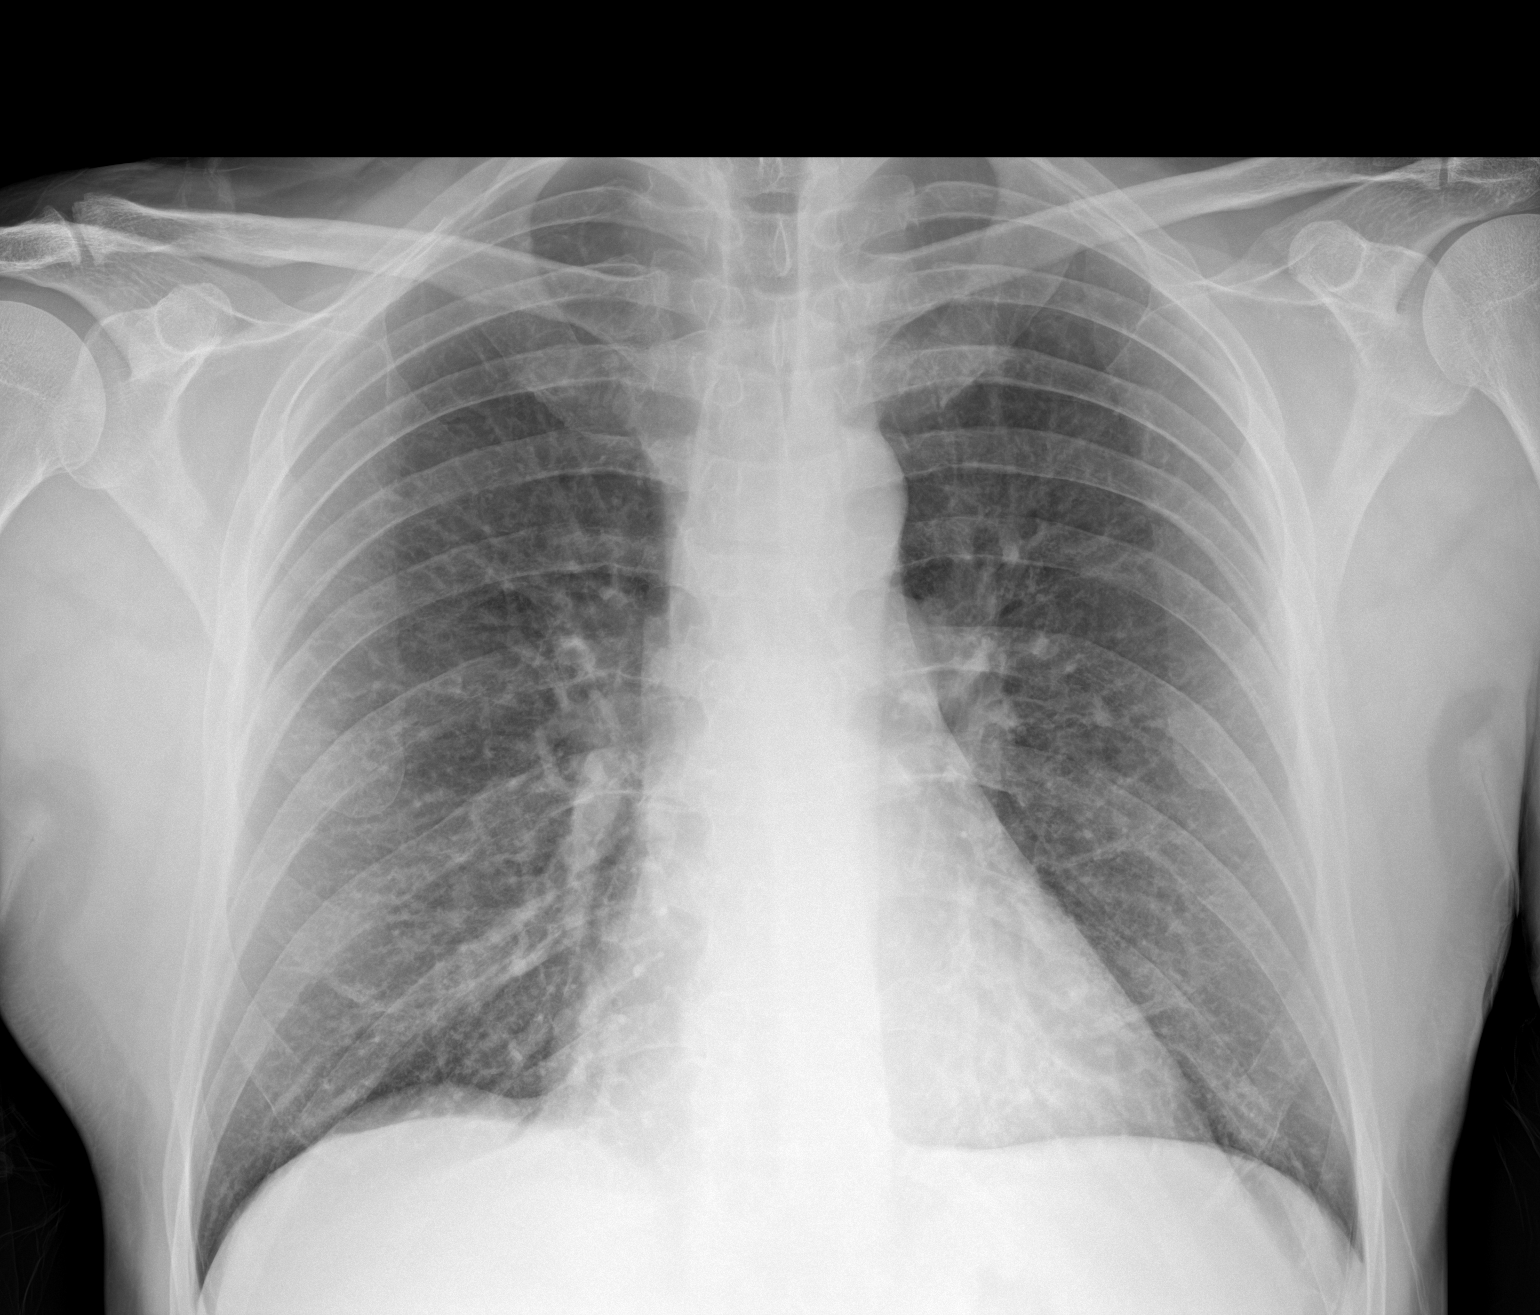

[1 of 1 positions shown; findings below may reference images not displayed]

FINDINGS: Normal mediastinum and cardiac silhouette. Normal pulmonary
vasculature. No evidence of effusion, infiltrate, or pneumothorax.
No acute bony abnormality.
IMPRESSION: Normal chest radiograph.

## 2021-04-05 HISTORY — PX: OTHER SURGICAL HISTORY: SHX169

## 2021-06-07 ENCOUNTER — Other Ambulatory Visit: Payer: Self-pay

## 2021-06-07 ENCOUNTER — Emergency Department
Admission: EM | Admit: 2021-06-07 | Discharge: 2021-06-07 | Disposition: A | Discharge status: Home / Self Care | Payer: BC Managed Care – PPO | Attending: Emergency Medicine | Admitting: Emergency Medicine

## 2021-06-07 DIAGNOSIS — J029 Acute pharyngitis, unspecified: Secondary | ICD-10-CM | POA: Diagnosis not present

## 2021-06-07 DIAGNOSIS — F17200 Nicotine dependence, unspecified, uncomplicated: Secondary | ICD-10-CM | POA: Insufficient documentation

## 2021-06-07 MED ORDER — AMOXICILLIN 875 MG PO TABS: 875.0000 mg | ORAL_TABLET | Freq: Two times a day (BID) | ORAL | 0 refills | Status: AC

## 2021-06-07 MED ORDER — NAPROXEN 500 MG PO TABS: 500.0000 mg | ORAL_TABLET | Freq: Two times a day (BID) | ORAL | 0 refills | Status: AC

## 2021-06-07 NOTE — ED Provider Notes (Signed)
? ?  [facility] ?Provider Note ? ? ? Event Date/Time  ? First MD Initiated Contact with Patient 06/07/21 1537   ?  (approximate) ? ? ?History  ? ?Sore Throat ? ? ?HPI ? ?Raymond Hamilton is a 51 y.o. male with history of GERD and as listed in EMR presents to the emergency department for treatment and evaluation of sore throat today.  He has also had intermittent cough for a week but states that that is not really anything new as he is a smoker.  No fever. ? ?  ? ? ?Physical Exam  ? ?Triage Vital Signs: ?ED Triage Vitals [06/07/21 1457]  ?Enc Vitals Group  ?   BP (!) 151/88  ?   Pulse Rate (!) 101  ?   Resp 18  ?   Temp 98.8 ?F (37.1 ?C)  ?   Temp Source Oral  ?   SpO2 96 %  ?   Weight 187 lb (84.8 kg)  ?   Height 6\' 1"  (1.854 m)  ?   Head Circumference   ?   Peak Flow   ?   Pain Score 8  ?   Pain Loc   ?   Pain Edu?   ?   Excl. in GC?   ? ? ?Most recent vital signs: ?Vitals:  ? 06/07/21 1457  ?BP: (!) 151/88  ?Pulse: (!) 101  ?Resp: 18  ?Temp: 98.8 ?F (37.1 ?C)  ?SpO2: 96%  ? ? ?General: Awake, no distress.  ?CV:  Good peripheral perfusion.  ?Resp:  Normal effort.  ?Abd:  No distention.  ?Other:  Tonsils 2+ with exudate and erythema ? ? ?ED Results / Procedures / Treatments  ? ?Labs ?(all labs ordered are listed, but only abnormal results are displayed) ?Labs Reviewed - No data to display ? ? ?EKG ? ?Not indicated ? ? ?RADIOLOGY ? ?Image and radiology report reviewed by me. ? ?Not indicated ? ?PROCEDURES: ? ?Critical Care performed: No ? ?Procedures ? ? ?MEDICATIONS ORDERED IN ED: ?Medications - No data to display ? ? ?IMPRESSION / MDM / ASSESSMENT AND PLAN / ED COURSE  ? ?I have reviewed the triage note. ? ?Differential diagnosis includes, but is not limited to, COVID, influenza, strep throat, viral pharyngitis. ? ?51 year old male presenting to the emergency department for treatment and evaluation of sore throat.  See HPI for further details. ? ?Exam is most consistent with streptococcal  pharyngitis as he does have swollen, edematous tonsils with exudate.  He will be treated with amoxicillin and Naprosyn.  He is to follow-up with primary care or return to the emergency department for symptoms that are not improving over the next few days. ? ? ?  ? ? ?FINAL CLINICAL IMPRESSION(S) / ED DIAGNOSES  ? ?Final diagnoses:  ?Exudative pharyngitis  ? ? ? ?Rx / DC Orders  ? ?ED Discharge Orders   ? ?      Ordered  ?  amoxicillin (AMOXIL) 875 MG tablet  2 times daily       ? 06/07/21 1546  ?  naproxen (NAPROSYN) 500 MG tablet  2 times daily with meals       ? 06/07/21 1546  ? ?  ?  ? ?  ? ? ? ?Note:  This document was prepared using Dragon voice recognition software and may include unintentional dictation errors. ?  ?08/07/21, FNP ?06/08/21 1553 ? ?  ?08/08/21, MD ?06/15/21 2352 ? ?

## 2021-06-07 NOTE — ED Notes (Signed)
See triage note. Pt to ED for cold, states has been congested and tired for 1 week with cough, pt has sore throat since this morning. Pt has odor of cigarette smoke. ?

## 2021-06-07 NOTE — ED Triage Notes (Signed)
Sore throat for today, cough for a week ?

## 2021-06-07 NOTE — ED Notes (Signed)
ED dispo pad not working ?

## 2021-06-07 NOTE — Discharge Instructions (Signed)
Follow up with primary care if not improving over the next few days.  Return to the ER for symptoms that change or worsen if unable to schedule an appointment. 

## 2021-07-01 ENCOUNTER — Emergency Department (HOSPITAL_COMMUNITY): Payer: BC Managed Care – PPO

## 2021-07-01 ENCOUNTER — Emergency Department (HOSPITAL_COMMUNITY)
Admission: EM | Admit: 2021-07-01 | Discharge: 2021-07-01 | Disposition: A | Discharge status: Home / Self Care | Payer: BC Managed Care – PPO | Attending: Emergency Medicine | Admitting: Emergency Medicine

## 2021-07-01 ENCOUNTER — Encounter (HOSPITAL_COMMUNITY): Payer: Self-pay

## 2021-07-01 DIAGNOSIS — R1013 Epigastric pain: Secondary | ICD-10-CM | POA: Insufficient documentation

## 2021-07-01 DIAGNOSIS — R0789 Other chest pain: Secondary | ICD-10-CM | POA: Insufficient documentation

## 2021-07-01 DIAGNOSIS — Z79899 Other long term (current) drug therapy: Secondary | ICD-10-CM | POA: Insufficient documentation

## 2021-07-01 LAB — CBC WITH DIFFERENTIAL/PLATELET
Abs Immature Granulocytes: 0.05 10*3/uL (ref 0.00–0.07)
Basophils Absolute: 0.1 10*3/uL (ref 0.0–0.1)
Basophils Relative: 0 %
Eosinophils Absolute: 0.1 10*3/uL (ref 0.0–0.5)
Eosinophils Relative: 0 %
HCT: 45.8 % (ref 39.0–52.0)
Hemoglobin: 16.4 g/dL (ref 13.0–17.0)
Immature Granulocytes: 0 %
Lymphocytes Relative: 15 %
Lymphs Abs: 2.1 10*3/uL (ref 0.7–4.0)
MCH: 35.4 pg — ABNORMAL HIGH (ref 26.0–34.0)
MCHC: 35.8 g/dL (ref 30.0–36.0)
MCV: 98.9 fL (ref 80.0–100.0)
Monocytes Absolute: 0.8 10*3/uL (ref 0.1–1.0)
Monocytes Relative: 5 %
Neutro Abs: 11.2 10*3/uL — ABNORMAL HIGH (ref 1.7–7.7)
Neutrophils Relative %: 80 %
Platelets: UNDETERMINED 10*3/uL (ref 150–400)
RBC: 4.63 MIL/uL (ref 4.22–5.81)
RDW: 12.3 % (ref 11.5–15.5)
WBC: 14.2 10*3/uL — ABNORMAL HIGH (ref 4.0–10.5)
nRBC: 0 % (ref 0.0–0.2)

## 2021-07-01 LAB — COMPREHENSIVE METABOLIC PANEL
ALT: 25 U/L (ref 0–44)
AST: 25 U/L (ref 15–41)
Albumin: 3.8 g/dL (ref 3.5–5.0)
Alkaline Phosphatase: 75 U/L (ref 38–126)
Anion gap: 9 (ref 5–15)
BUN: <5 mg/dL — ABNORMAL LOW (ref 6–20)
CO2: 22 mmol/L (ref 22–32)
Calcium: 9.2 mg/dL (ref 8.9–10.3)
Chloride: 105 mmol/L (ref 98–111)
Creatinine, Ser: 0.8 mg/dL (ref 0.61–1.24)
GFR, Estimated: >60 mL/min (ref >60)
Glucose, Bld: 154 mg/dL — ABNORMAL HIGH (ref 70–99)
Potassium: 3.6 mmol/L (ref 3.5–5.1)
Sodium: 136 mmol/L (ref 135–145)
Total Bilirubin: 0.6 mg/dL (ref 0.3–1.2)
Total Protein: 6.6 g/dL (ref 6.5–8.1)

## 2021-07-01 LAB — TROPONIN I (HIGH SENSITIVITY)
Troponin I (High Sensitivity): 5 ng/L (ref <18)
Troponin I (High Sensitivity): 4 ng/L (ref <18)

## 2021-07-01 LAB — LIPASE, BLOOD: Lipase: 37 U/L (ref 11–51)

## 2021-07-01 MED ORDER — MORPHINE SULFATE (PF) 4 MG/ML IV SOLN
4.0000 mg | Freq: Once | INTRAVENOUS | Status: AC
  Administered 2021-07-01: 4 mg via INTRAVENOUS
  Filled 2021-07-01: qty 1

## 2021-07-01 MED ORDER — IOHEXOL 350 MG/ML SOLN
100.0000 mL | Freq: Once | INTRAVENOUS | Status: AC | PRN
  Administered 2021-07-01: 100 mL via INTRAVENOUS

## 2021-07-01 MED ORDER — HYDROMORPHONE HCL 1 MG/ML IJ SOLN
1.0000 mg | Freq: Once | INTRAMUSCULAR | Status: AC
  Administered 2021-07-01: 1 mg via INTRAVENOUS
  Filled 2021-07-01: qty 1

## 2021-07-01 MED ORDER — SUCRALFATE 1 GM/10ML PO SUSP
1.0000 g | Freq: Three times a day (TID) | ORAL | 0 refills | Status: AC
Start: 2021-07-01 — End: ?

## 2021-07-01 MED ORDER — PANTOPRAZOLE SODIUM 40 MG IV SOLR
40.0000 mg | Freq: Once | INTRAVENOUS | Status: AC
  Administered 2021-07-01: 40 mg via INTRAVENOUS
  Filled 2021-07-01: qty 10

## 2021-07-01 MED ORDER — LIDOCAINE VISCOUS HCL 2 % MT SOLN
15.0000 mL | Freq: Once | OROMUCOSAL | Status: AC
Start: 2021-07-01 — End: 2021-07-01
  Administered 2021-07-01: 15 mL via ORAL
  Filled 2021-07-01: qty 15

## 2021-07-01 MED ORDER — ONDANSETRON HCL 4 MG/2ML IJ SOLN
4.0000 mg | Freq: Once | INTRAMUSCULAR | Status: AC
  Administered 2021-07-01: 4 mg via INTRAVENOUS
  Filled 2021-07-01: qty 2

## 2021-07-01 MED ORDER — ALUM & MAG HYDROXIDE-SIMETH 200-200-20 MG/5ML PO SUSP
30.0000 mL | Freq: Once | ORAL | Status: AC
  Administered 2021-07-01: 30 mL via ORAL
  Filled 2021-07-01: qty 30

## 2021-07-01 NOTE — Discharge Instructions (Addendum)
Increase your protonix to one tablet twice daily for the next week.  Please call your gastroenterologist to have close follow-up.  Get rechecked if you have worsening or new concerning symptoms. ?

## 2021-07-01 NOTE — ED Triage Notes (Signed)
Pt comes via [facility] from home, woke up about an hour an half ago, with central CP, was pale, feels like tearing, PTA received 324 ASA, 2 nitro and 150 mcg fentanyl  ?

## 2021-07-01 NOTE — ED Provider Notes (Signed)
?[facility] EMERGENCY DEPARTMENT ?Provider Note ? ? ?CSN: [id number] ?Arrival date & time: 07/01/21  0111 ? ?  ? ?History ? ?Chief Complaint  ?Patient presents with  ? Chest Pain  ? ? ?Raymond Hamilton is a 51 y.o. male. ? ?The history is provided by the patient, the EMS personnel and medical records.  ?Raymond Hamilton is a 51 y.o. male who presents to the Emergency Department complaining of chest pain.  He presents to the emergency department by EMS for evaluation of severe and stabbing central chest pain that started at 11:30 PM.  Pain is constant and nonradiating.  He has associated nausea and dry heaves but no vomiting.  No prior similar symptoms.  He also describes the pain is located in the epigastric region as well.  He reports no history of any medical problems.  EMS does report that he has esophageal stricture.  He does use tobacco daily, drinks 5 beers daily.  No drug use. ?  ? ?Home Medications ?Prior to Admission medications   ?Medication Sig Start Date End Date Taking? Authorizing Provider  ?sucralfate (CARAFATE) 1 GM/10ML suspension Take 10 mLs (1 g total) by mouth 4 (four) times daily -  with meals and at bedtime. 07/01/21  Yes Tilden Fossa, MD  ?amoxicillin (AMOXIL) 875 MG tablet Take 1 tablet (875 mg total) by mouth 2 (two) times daily. 06/07/21   Triplett, Rulon Eisenmenger B, FNP  ?gabapentin (NEURONTIN) 100 MG capsule Take 100 mg by mouth 2 (two) times daily. 12/22/16   [provider]  ?losartan (COZAAR) 25 MG tablet Take 25 mg by mouth daily.    [provider]  ?naproxen (NAPROSYN) 500 MG tablet Take 1 tablet (500 mg total) by mouth 2 (two) times daily with a meal. 06/07/21   Triplett, Kasandra Knudsen, FNP  ?omeprazole (PRILOSEC) 40 MG capsule Take 40 mg by mouth 2 (two) times daily. 10/01/16 10/01/17  [provider]  ?pantoprazole (PROTONIX) 40 MG tablet Take 40 mg by mouth daily. 06/09/21   [provider]  ?sildenafil (VIAGRA) 50 MG tablet Take 50 mg by mouth daily as  needed for erectile dysfunction. 05/21/21   [provider]  ?triamcinolone cream (KENALOG) 0.5 % Apply 1 application. topically 2 (two) times daily. 05/08/21   [provider]  ?   ? ?Allergies    ?Patient has no known allergies.   ? ?Review of Systems   ?Review of Systems  ?All other systems reviewed and are negative. ? ?Physical Exam ?Updated Vital Signs ?BP 101/71   Pulse 66   Temp 98.5 ?F (36.9 ?C) (Oral)   Resp 16   SpO2 95%  ?Physical Exam ?Vitals and nursing note reviewed.  ?Constitutional:   ?   General: He is in acute distress.  ?   Appearance: He is well-developed. He is ill-appearing.  ?HENT:  ?   Head: Normocephalic and atraumatic.  ?Cardiovascular:  ?   Rate and Rhythm: Regular rhythm. Tachycardia present.  ?   Heart sounds: No murmur heard. ?Pulmonary:  ?   Effort: Pulmonary effort is normal. No respiratory distress.  ?   Breath sounds: Normal breath sounds.  ?Abdominal:  ?   Palpations: Abdomen is soft.  ?   Tenderness: There is no guarding or rebound.  ?   Comments: Mild epigastric tenderness  ?Musculoskeletal:     ?   General: No tenderness.  ?Skin: ?   General: Skin is dry.  ?   Comments:  2+ radial and DP pulses bilaterally  ?Neurological:  ?   Mental Status: He is alert and oriented to person, place, and time.  ?Psychiatric:     ?   Behavior: Behavior normal.  ? ? ?ED Results / Procedures / Treatments   ?Labs ?(all labs ordered are listed, but only abnormal results are displayed) ?Labs Reviewed  ?COMPREHENSIVE METABOLIC PANEL - Abnormal; Notable for the following components:  ?    Result Value  ? Glucose, Bld 154 (*)   ? BUN <5 (*)   ? All other components within normal limits  ?CBC WITH DIFFERENTIAL/PLATELET - Abnormal; Notable for the following components:  ? WBC 14.2 (*)   ? MCH 35.4 (*)   ? Neutro Abs 11.2 (*)   ? All other components within normal limits  ?LIPASE, BLOOD  ?TROPONIN I (HIGH SENSITIVITY)  ?TROPONIN I (HIGH SENSITIVITY)  ? ? ?EKG ?EKG  Interpretation ? ?Date/Time:  Wednesday July 01 2021 01:31:53 EDT ?Ventricular Rate:  91 ?PR Interval:  160 ?QRS Duration: 103 ?QT Interval:  368 ?QTC Calculation: 453 ?R Axis:   85 ?Text Interpretation: Sinus rhythm Confirmed by Tilden Fossaees, Caasi Giglia [phone]([id number]) on 07/01/2021 1:48:32 AM ? ?Radiology ?DG Chest Port 1 View ? ?Result Date: 07/01/2021 ?CLINICAL DATA:  Chest pain. EXAM: PORTABLE CHEST 1 VIEW COMPARISON:  04/27/2019. FINDINGS: The heart size and mediastinal contours are within normal limits. No consolidation, effusion, or pneumothorax. No acute osseous abnormality. IMPRESSION: No active cardiopulmonary disease. Electronically Signed   By: Thornell SartoriusLaura  Taylor M.D.   On: 07/01/2021 01:34  ? ?CT Angio Chest/Abd/Pel for Dissection W and/or W/WO ? ?Result Date: 07/01/2021 ?CLINICAL DATA:  Acute aortic syndrome suspected. EXAM: CT ANGIOGRAPHY CHEST, ABDOMEN AND PELVIS TECHNIQUE: Non-contrast CT of the chest was initially obtained. Multidetector CT imaging through the chest, abdomen and pelvis was performed using the standard protocol during bolus administration of intravenous contrast. Multiplanar reconstructed images and MIPs were obtained and reviewed to evaluate the vascular anatomy. RADIATION DOSE REDUCTION: This exam was performed according to the departmental dose-optimization program which includes automated exposure control, adjustment of the mA and/or kV according to patient size and/or use of iterative reconstruction technique. CONTRAST:  100mL OMNIPAQUE IOHEXOL 350 MG/ML SOLN COMPARISON:  CT of the abdomen pelvis dated 06/07/2019. FINDINGS: CTA CHEST FINDINGS Cardiovascular: There is no cardiomegaly or pericardial effusion. Mild atherosclerotic calcification of the thoracic aorta. No aneurysmal dilatation or dissection. The origins of the great vessels of the aortic arch appear patent as visualized. No pulmonary artery embolus identified. Mediastinum/Nodes: No hilar or mediastinal adenopathy. The esophagus is grossly  unremarkable. No mediastinal fluid collection. Lungs/Pleura: Minimal bibasilar dependent atelectasis. No focal consolidation, pleural effusion, or pneumothorax. The central airways are patent. Musculoskeletal: No chest wall abnormality. No acute or significant osseous findings. Review of the MIP images confirms the above findings. CTA ABDOMEN AND PELVIS FINDINGS VASCULAR Aorta: Mild atherosclerotic calcification. No aneurysmal dilatation or dissection. No periaortic fluid collection. Celiac: Patent without evidence of aneurysm, dissection, vasculitis or significant stenosis. SMA: Patent without evidence of aneurysm, dissection, vasculitis or significant stenosis. Renals: Both renal arteries are patent without evidence of aneurysm, dissection, vasculitis, fibromuscular dysplasia or significant stenosis. IMA: Patent without evidence of aneurysm, dissection, vasculitis or significant stenosis. Inflow: Atherosclerotic calcification. No aneurysmal dilatation or dissection. The iliac arteries are patent. Veins: No obvious venous abnormality within the limitations of this arterial phase study. Review of the MIP images confirms the above findings. NON-VASCULAR No intra-abdominal free air or free fluid. Hepatobiliary: No focal  liver abnormality is seen. No gallstones, gallbladder wall thickening, or biliary dilatation. Pancreas: Unremarkable. No pancreatic ductal dilatation or surrounding inflammatory changes. Spleen: Normal in size without focal abnormality. Adrenals/Urinary Tract: Adrenal glands are unremarkable. Kidneys are normal, without renal calculi, focal lesion, or hydronephrosis. Bladder is unremarkable. Stomach/Bowel: There is sigmoid diverticulosis without active inflammatory changes. There is no bowel obstruction or active inflammation. The appendix is unremarkable. Lymphatic: No adenopathy. Reproductive: The prostate and seminal vesicles are grossly unremarkable. No pelvic mass. Other: No abdominal wall hernia  or abnormality. No abdominopelvic ascites. Musculoskeletal: Bilateral L5 pars defects with grade 1 L5-S1 anterolisthesis. Review of the MIP images confirms the above findings. IMPRESSION: 1. No acute intrathoracic, abdominal,

## 2021-09-08 ENCOUNTER — Other Ambulatory Visit: Payer: Self-pay | Admitting: Internal Medicine

## 2021-09-08 DIAGNOSIS — R1011 Right upper quadrant pain: Secondary | ICD-10-CM

## 2021-09-09 ENCOUNTER — Ambulatory Visit
Admission: RE | Admit: 2021-09-09 | Discharge: 2021-09-09 | Disposition: A | Discharge status: Home / Self Care | Payer: BC Managed Care – PPO | Source: Ambulatory Visit | Attending: Internal Medicine | Admitting: Internal Medicine

## 2021-09-09 DIAGNOSIS — R1011 Right upper quadrant pain: Secondary | ICD-10-CM | POA: Diagnosis present

## 2021-09-15 ENCOUNTER — Ambulatory Visit: Payer: Self-pay | Admitting: General Surgery

## 2021-09-15 NOTE — H&P (View-Only) (Signed)
PATIENT PROFILE: Raymond Hamilton is a 51 y.o. male who presents to the Clinic for consultation at the request of Dr. Johnston for evaluation of cholelithiasis.  PCP:  Johnston, John David, MD  HISTORY OF PRESENT ILLNESS: [name] reports he has had multiple episode of epigastric pain.  He went to the ED on March due to these epigastric pain.  Pain aggravated by eating.  No pain radiation.  He was recently seen by his PCP due to right-sided abdominal pain.  Patient cannot identify any alleviating or exacerbating factor for this right-sided abdominal pain.  Pain is not aggravated by eating.  He denies any fever or chills.  He had an abdominal abdominal ultrasound done for evaluation of the right-sided abdominal pain.  He was found with gallstones without sign of cholecystitis.  I personally evaluated the images.   PROBLEM LIST: Problem List  Date Reviewed: 09/08/2021          Noted   Tobacco use 05/06/2020   GERD (gastroesophageal reflux disease) 06/10/2014   Hyperlipidemia 06/10/2014   History of peptic ulcer disease 06/10/2014   Chronic neck pain 06/10/2014    GENERAL REVIEW OF SYSTEMS:   General ROS: negative for - chills, fatigue, fever, weight gain or weight loss Allergy and Immunology ROS: negative for - hives  Hematological and Lymphatic ROS: negative for - bleeding problems or bruising, negative for palpable nodes Endocrine ROS: negative for - heat or cold intolerance, hair changes Respiratory ROS: negative for - cough, shortness of breath or wheezing Cardiovascular ROS: no chest pain or palpitations GI ROS: negative for nausea, vomiting, diarrhea, constipation.  Positive for abdominal pain Musculoskeletal ROS: negative for - joint swelling or muscle pain Neurological ROS: negative for - confusion, syncope Dermatological ROS: negative for pruritus and rash Psychiatric: negative for anxiety, depression, difficulty sleeping and memory loss  MEDICATIONS: Current Outpatient Medications   Medication Sig Dispense Refill   gabapentin (NEURONTIN) 100 MG capsule TAKE 1 CAPSULE BY MOUTH TWICE A DAY 60 capsule 2   ibuprofen (MOTRIN) 200 MG tablet Take 600 mg by mouth every 6 (six) hours as needed for Pain     pantoprazole (PROTONIX) 40 MG DR tablet TAKE 1 TABLET BY MOUTH EVERY DAY 90 tablet 1   sucralfate (CARAFATE) 100 mg/mL suspension Take by mouth     triamcinolone 0.5 % cream Apply topically 2 (two) times daily 30 g 0   losartan (COZAAR) 50 MG tablet Take 1 tablet (50 mg total) by mouth once daily 90 tablet 3   montelukast (SINGULAIR) 10 mg tablet TAKE 1 TABLET BY MOUTH EVERY DAY AT NIGHT (Patient not taking: Reported on 07/03/2021) 30 tablet 5   sildenafiL (VIAGRA) 50 MG tablet Take 1 tablet (50 mg total) by mouth once daily as needed for Erectile Dysfunction for up to 30 days 10 tablet 6   No current facility-administered medications for this visit.    ALLERGIES: Patient has no known allergies.  PAST MEDICAL HISTORY: Past Medical History:  Diagnosis Date   GERD (gastroesophageal reflux disease)    Hyperlipidemia    PUD (peptic ulcer disease)     PAST SURGICAL HISTORY: Past Surgical History:  Procedure Laterality Date   COLONOSCOPY  09/18/2019   Tubular adenoma of the colon/Hyperplastic colon polyp/Repeat 7yrs/TKT   EGD  09/18/2019   Esophagitis/No Repeat/TKT   EGD  07/16/2021   Non-obstructiong Schatzki ring.Dilated/Reflux esophagitis   tumor excision back of neck as a child       FAMILY HISTORY: Family History    Problem Relation Age of Onset   High blood pressure (Hypertension) Mother    Diabetes type II Father      SOCIAL HISTORY: Social History   Socioeconomic History   Marital status: Married  Tobacco Use   Smoking status: Every Day    Packs/day: 0.50    Years: 20.00    Pack years: 10.00    Types: Cigarettes   Smokeless tobacco: Former    Types: Nurse, children's Use: Never used  Substance and Sexual Activity   Alcohol use: Yes     Alcohol/week: 0.0 standard drinks   Drug use: No   Sexual activity: Yes    PHYSICAL EXAM: Vitals:   09/15/21 0811  BP: 137/84  Pulse: 95   Body mass index is 22.82 kg/m. Weight: 78.5 kg (173 lb)   GENERAL: Alert, active, oriented x3  HEENT: Pupils equal reactive to light. Extraocular movements are intact. Sclera clear. Palpebral conjunctiva normal red color.Pharynx clear.  NECK: Supple with no palpable mass and no adenopathy.  LUNGS: Sound clear with no rales rhonchi or wheezes.  HEART: Regular rhythm S1 and S2 without murmur.  ABDOMEN: Soft and depressible, nontender with no palpable mass, no hepatomegaly.  Tender to palpation on the right side of the ribs.  EXTREMITIES: Well-developed well-nourished symmetrical with no dependent edema.  NEUROLOGICAL: Awake alert oriented, facial expression symmetrical, moving all extremities.  REVIEW OF DATA: I have reviewed the following data today: No visits with results within 3 Month(s) from this visit.  Latest known visit with results is:  Appointment on 05/06/2021  Component Date Value   Glucose 05/06/2021 102    Sodium 05/06/2021 142    Potassium 05/06/2021 4.4    Chloride 05/06/2021 107    Carbon Dioxide (CO2) 05/06/2021 27.7    Urea Nitrogen (BUN) 05/06/2021 7    Creatinine 05/06/2021 0.8    Glomerular Filtration Ra* 05/06/2021 102    Calcium 05/06/2021 9.6    AST  05/06/2021 20    ALT  05/06/2021 22    Alk Phos (alkaline Phosp* 05/06/2021 88    Albumin 05/06/2021 4.2    Bilirubin, Total 05/06/2021 0.6    Protein, Total 05/06/2021 6.7    A/G Ratio 05/06/2021 1.7    WBC (White Blood Cell Co* 05/06/2021 5.6    RBC (Red Blood Cell Coun* 05/06/2021 4.57 (L)    Hemoglobin 05/06/2021 15.8    Hematocrit 05/06/2021 45.0    MCV (Mean Corpuscular Vo* 05/06/2021 98.5    MCH (Mean Corpuscular He* 05/06/2021 34.6 (H)    MCHC (Mean Corpuscular H* 05/06/2021 35.1    Platelet Count 05/06/2021 173    RDW-CV (Red Cell Distrib*  05/06/2021 12.3    MPV (Mean Platelet Volum* 05/06/2021 10.0    Neutrophils 05/06/2021 2.86    Lymphocytes 05/06/2021 2.06    Monocytes 05/06/2021 0.47    Eosinophils 05/06/2021 0.19    Basophils 05/06/2021 0.05    Neutrophil % 05/06/2021 50.7    Lymphocyte % 05/06/2021 36.5    Monocyte % 05/06/2021 8.3    Eosinophil % 05/06/2021 3.4    Basophil% 05/06/2021 0.9    Immature Granulocyte % 05/06/2021 0.2    Immature Granulocyte Cou* 05/06/2021 0.01    Cholesterol, Total 05/06/2021 174    Triglyceride 05/06/2021 135    HDL (High Density Lipopr* 05/06/2021 37.8    LDL Calculated 05/06/2021 109    VLDL Cholesterol 05/06/2021 27    Cholesterol/HDL Ratio 05/06/2021 4.6    PSA (  Prostate Specific A* 05/06/2021 0.45    Color 05/06/2021 Yellow    Clarity 05/06/2021 Clear    Specific Gravity 05/06/2021 1.020    pH, Urine 05/06/2021 6.0    Protein, Urinalysis 05/06/2021 Negative    Glucose, Urinalysis 05/06/2021 Negative    Ketones, Urinalysis 05/06/2021 Negative    Blood, Urinalysis 05/06/2021 Negative    Nitrite, Urinalysis 05/06/2021 Negative    Leukocyte Esterase, Urin* 05/06/2021 Negative    White Blood Cells, Urina* 05/06/2021 None Seen    Red Blood Cells, Urinaly* 05/06/2021 None Seen    Bacteria, Urinalysis 05/06/2021 None Seen    Squamous Epithelial Cell* 05/06/2021 None Seen      ASSESSMENT: Mr. Sheaffer is a 51 y.o. male presenting for consultation for cholelithiasis.  I discussed with the patient that he has 2 things going on.  He has had episode of biliary colic's that are present and as the epigastric pain does send him to the ED.  The right-sided close pain on palpation is not from cholecystitis.  I discussed with the patient that cholecystectomy will not help with the right-sided costal pain but it should help with with the episodes of the epigastric pain.  Patient was oriented about the diagnosis of cholelithiasis. Also oriented about what is the gallbladder, its anatomy  and function and the implications of having stones. The patient was oriented about the treatment alternatives (observation vs cholecystectomy). Patient was oriented that a low percentage of patient will continue to have similar pain symptoms even after the gallbladder is removed. Surgical technique (open vs laparoscopic) was discussed. It was also discussed the goals of the surgery (decrease the pain episodes and avoid the risk of cholecystitis) and the risk of surgery including: bleeding, infection, common bile duct injury, stone retention, injury to other organs such as bowel, liver, stomach, other complications such as hernia, bowel obstruction among others. Also discussed with patient about anesthesia and its complications such as: reaction to medications, pneumonia, heart complications, death, among others.   Cholelithiasis without cholecystitis [K80.20]  PLAN: 1.  Robotic assisted laparoscopic cholecystectomy ([id number]) 2.  CBC, CMP done 3.  Do not take aspirin 5 days before the procedure 4.  Continue taking ibuprofen and heating pads for the right sided rib pain 5.  Contact us if has any question or concern.   Patient and his wife verbalized understanding, all questions were answered, and were agreeable with the plan outlined above.     Herbert Pun, MD  Electronically signed by Herbert Pun, MD

## 2021-09-15 NOTE — H&P (Signed)
PATIENT PROFILE: Raymond Hamilton is a 51 y.o. male who presents to the Clinic for consultation at the request of Dr. Edwina Barth for evaluation of cholelithiasis.  PCP:  Velna Ochs, MD  HISTORY OF PRESENT ILLNESS: [name] reports he has had multiple episode of epigastric pain.  He went to the ED on March due to these epigastric pain.  Pain aggravated by eating.  No pain radiation.  He was recently seen by his PCP due to right-sided abdominal pain.  Patient cannot identify any alleviating or exacerbating factor for this right-sided abdominal pain.  Pain is not aggravated by eating.  He denies any fever or chills.  He had an abdominal abdominal ultrasound done for evaluation of the right-sided abdominal pain.  He was found with gallstones without sign of cholecystitis.  I personally evaluated the images.   PROBLEM LIST: Problem List  Date Reviewed: 09/08/2021          Noted   Tobacco use 05/06/2020   GERD (gastroesophageal reflux disease) 06/10/2014   Hyperlipidemia 06/10/2014   History of peptic ulcer disease 06/10/2014   Chronic neck pain 06/10/2014    GENERAL REVIEW OF SYSTEMS:   General ROS: negative for - chills, fatigue, fever, weight gain or weight loss Allergy and Immunology ROS: negative for - hives  Hematological and Lymphatic ROS: negative for - bleeding problems or bruising, negative for palpable nodes Endocrine ROS: negative for - heat or cold intolerance, hair changes Respiratory ROS: negative for - cough, shortness of breath or wheezing Cardiovascular ROS: no chest pain or palpitations GI ROS: negative for nausea, vomiting, diarrhea, constipation.  Positive for abdominal pain Musculoskeletal ROS: negative for - joint swelling or muscle pain Neurological ROS: negative for - confusion, syncope Dermatological ROS: negative for pruritus and rash Psychiatric: negative for anxiety, depression, difficulty sleeping and memory loss  MEDICATIONS: Current Outpatient Medications   Medication Sig Dispense Refill   gabapentin (NEURONTIN) 100 MG capsule TAKE 1 CAPSULE BY MOUTH TWICE A DAY 60 capsule 2   ibuprofen (MOTRIN) 200 MG tablet Take 600 mg by mouth every 6 (six) hours as needed for Pain     pantoprazole (PROTONIX) 40 MG DR tablet TAKE 1 TABLET BY MOUTH EVERY DAY 90 tablet 1   sucralfate (CARAFATE) 100 mg/mL suspension Take by mouth     triamcinolone 0.5 % cream Apply topically 2 (two) times daily 30 g 0   losartan (COZAAR) 50 MG tablet Take 1 tablet (50 mg total) by mouth once daily 90 tablet 3   montelukast (SINGULAIR) 10 mg tablet TAKE 1 TABLET BY MOUTH EVERY DAY AT NIGHT (Patient not taking: Reported on 07/03/2021) 30 tablet 5   sildenafiL (VIAGRA) 50 MG tablet Take 1 tablet (50 mg total) by mouth once daily as needed for Erectile Dysfunction for up to 30 days 10 tablet 6   No current facility-administered medications for this visit.    ALLERGIES: Patient has no known allergies.  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       FAMILY HISTORY: Family History  Problem Relation Age of Onset   High blood pressure (Hypertension) Mother    Diabetes type II Father      SOCIAL HISTORY: Social History   Socioeconomic History   Marital status: Married  Tobacco Use   Smoking status: Every Day    Packs/day: 0.50    Years: 20.00    Pack years: 10.00    Types: Cigarettes   Smokeless tobacco: Former    Types: Nurse, children's Use: Never used  Substance and Sexual Activity   Alcohol use: Yes     Alcohol/week: 0.0 standard drinks   Drug use: No   Sexual activity: Yes    PHYSICAL EXAM: Vitals:   09/15/21 0811  BP: 137/84  Pulse: 95   Body mass index is 22.82 kg/m. Weight: 78.5 kg (173 lb)   GENERAL: Alert, active, oriented x3  HEENT: Pupils equal reactive to light. Extraocular movements are intact. Sclera clear. Palpebral conjunctiva normal red color.Pharynx clear.  NECK: Supple with no palpable mass and no adenopathy.  LUNGS: Sound clear with no rales rhonchi or wheezes.  HEART: Regular rhythm S1 and S2 without murmur.  ABDOMEN: Soft and depressible, nontender with no palpable mass, no hepatomegaly.  Tender to palpation on the right side of the ribs.  EXTREMITIES: Well-developed well-nourished symmetrical with no dependent edema.  NEUROLOGICAL: Awake alert oriented, facial expression symmetrical, moving all extremities.  REVIEW OF DATA: I have reviewed the following data today: No visits with results within 3 Month(s) from this visit.  Latest known visit with results is:  Appointment on 05/06/2021  Component Date Value   Glucose 05/06/2021 102    Sodium 05/06/2021 142    Potassium 05/06/2021 4.4    Chloride 05/06/2021 107    Carbon Dioxide (CO2) 05/06/2021 27.7    Urea Nitrogen (BUN) 05/06/2021 7    Creatinine 05/06/2021 0.8    Glomerular Filtration Ra* 05/06/2021 102    Calcium 05/06/2021 9.6    AST  05/06/2021 20    ALT  05/06/2021 22    Alk Phos (alkaline Phosp* 05/06/2021 88    Albumin 05/06/2021 4.2    Bilirubin, Total 05/06/2021 0.6    Protein, Total 05/06/2021 6.7    A/G Ratio 05/06/2021 1.7    WBC (White Blood Cell Co* 05/06/2021 5.6    RBC (Red Blood Cell Coun* 05/06/2021 4.57 (L)    Hemoglobin 05/06/2021 15.8    Hematocrit 05/06/2021 45.0    MCV (Mean Corpuscular Vo* 05/06/2021 98.5    MCH (Mean Corpuscular He* 05/06/2021 34.6 (H)    MCHC (Mean Corpuscular H* 05/06/2021 35.1    Platelet Count 05/06/2021 173    RDW-CV (Red Cell Distrib*  05/06/2021 12.3    MPV (Mean Platelet Volum* 05/06/2021 10.0    Neutrophils 05/06/2021 2.86    Lymphocytes 05/06/2021 2.06    Monocytes 05/06/2021 0.47    Eosinophils 05/06/2021 0.19    Basophils 05/06/2021 0.05    Neutrophil % 05/06/2021 50.7    Lymphocyte % 05/06/2021 36.5    Monocyte % 05/06/2021 8.3    Eosinophil % 05/06/2021 3.4    Basophil% 05/06/2021 0.9    Immature Granulocyte % 05/06/2021 0.2    Immature Granulocyte Cou* 05/06/2021 0.01    Cholesterol, Total 05/06/2021 174    Triglyceride 05/06/2021 135    HDL (High Density Lipopr* 05/06/2021 37.8    LDL Calculated 05/06/2021 109    VLDL Cholesterol 05/06/2021 27    Cholesterol/HDL Ratio 05/06/2021 4.6    PSA (  Prostate Specific A* 05/06/2021 0.45    Color 05/06/2021 Yellow    Clarity 05/06/2021 Clear    Specific Gravity 05/06/2021 1.020    pH, Urine 05/06/2021 6.0    Protein, Urinalysis 05/06/2021 Negative    Glucose, Urinalysis 05/06/2021 Negative    Ketones, Urinalysis 05/06/2021 Negative    Blood, Urinalysis 05/06/2021 Negative    Nitrite, Urinalysis 05/06/2021 Negative    Leukocyte Esterase, Urin* 05/06/2021 Negative    White Blood Cells, Urina* 05/06/2021 None Seen    Red Blood Cells, Urinaly* 05/06/2021 None Seen    Bacteria, Urinalysis 05/06/2021 None Seen    Squamous Epithelial Cell* 05/06/2021 None Seen      ASSESSMENT: Mr. Ishler is a 51 y.o. male presenting for consultation for cholelithiasis.  I discussed with the patient that he has 2 things going on.  He has had episode of biliary colic's that are present and as the epigastric pain does send him to the ED.  The right-sided close pain on palpation is not from cholecystitis.  I discussed with the patient that cholecystectomy will not help with the right-sided costal pain but it should help with with the episodes of the epigastric pain.  Patient was oriented about the diagnosis of cholelithiasis. Also oriented about what is the gallbladder, its anatomy  and function and the implications of having stones. The patient was oriented about the treatment alternatives (observation vs cholecystectomy). Patient was oriented that a low percentage of patient will continue to have similar pain symptoms even after the gallbladder is removed. Surgical technique (open vs laparoscopic) was discussed. It was also discussed the goals of the surgery (decrease the pain episodes and avoid the risk of cholecystitis) and the risk of surgery including: bleeding, infection, common bile duct injury, stone retention, injury to other organs such as bowel, liver, stomach, other complications such as hernia, bowel obstruction among others. Also discussed with patient about anesthesia and its complications such as: reaction to medications, pneumonia, heart complications, death, among others.   Cholelithiasis without cholecystitis [K80.20]  PLAN: 1.  Robotic assisted laparoscopic cholecystectomy (16384) 2.  CBC, CMP done 3.  Do not take aspirin 5 days before the procedure 4.  Continue taking ibuprofen and heating pads for the right sided rib pain 5.  Contact us if has any question or concern.   Patient and his wife verbalized understanding, all questions were answered, and were agreeable with the plan outlined above.     Herbert Pun, MD  Electronically signed by Herbert Pun, MD

## 2021-09-22 ENCOUNTER — Other Ambulatory Visit: Payer: Self-pay

## 2021-09-22 ENCOUNTER — Encounter
Admission: RE | Admit: 2021-09-22 | Discharge: 2021-09-22 | Disposition: A | Discharge status: Home / Self Care | Payer: BC Managed Care – PPO | Source: Ambulatory Visit | Attending: General Surgery | Admitting: General Surgery

## 2021-09-22 HISTORY — DX: Pneumonia, unspecified organism: J18.9

## 2021-09-22 MED ORDER — LACTATED RINGERS IV SOLN: INTRAVENOUS | Status: DC

## 2021-09-22 MED ORDER — ORAL CARE MOUTH RINSE: 15.0000 mL | Freq: Once | OROMUCOSAL | Status: AC

## 2021-09-22 MED ORDER — CEFAZOLIN SODIUM-DEXTROSE 2-4 GM/100ML-% IV SOLN
2.0000 g | INTRAVENOUS | Status: AC
  Administered 2021-09-23: 2 g via INTRAVENOUS

## 2021-09-22 MED ORDER — INDOCYANINE GREEN 25 MG IV SOLR
1.2500 mg | Freq: Once | INTRAVENOUS | Status: AC
  Administered 2021-09-23: 1.25 mg via INTRAVENOUS
  Filled 2021-09-22: qty 0.5

## 2021-09-22 MED ORDER — CHLORHEXIDINE GLUCONATE 0.12 % MT SOLN
15.0000 mL | Freq: Once | OROMUCOSAL | Status: AC
  Administered 2021-09-23: 15 mL via OROMUCOSAL

## 2021-09-22 NOTE — Patient Instructions (Addendum)
Your procedure is scheduled on: 09/23/21 - Wednesday Report to the Registration Desk on the 1st floor of the [facility]. To find out your arrival time, please call [phone] between 1PM - 3PM on: 09/22/21 - Tuesday If your arrival time is 6:00 am, do not arrive prior to that time as the [facility] entrance doors do not open until 6:00 am.  REMEMBER: Instructions that are not followed completely may result in serious medical risk, up to and including death; or upon the discretion of your surgeon and anesthesiologist your surgery may need to be rescheduled.  Do not eat food or drink any fluids after midnight the night before surgery.  No gum chewing, lozengers or hard candies.   TAKE THESE MEDICATIONS THE MORNING OF SURGERY WITH A SIP OF WATER:  - gabapentin (NEURONTIN) 100 MG capsule  - pantoprazole (PROTONIX) 40 MG tablet , (take one the night before and one on the morning of surgery - helps to prevent nausea after surgery.)  One week prior to surgery: Stop Anti-inflammatories (NSAIDS) such as Advil, Aleve, Ibuprofen, Motrin, Naproxen, Naprosyn and Aspirin based products such as Excedrin, Goodys Powder, BC Powder.  Stop ANY OVER THE COUNTER supplements until after surgery.  You take Tylenol if needed for pain up until the day of surgery.  No Alcohol for 24 hours before or after surgery.  No Smoking including e-cigarettes for 24 hours prior to surgery.  No chewable tobacco products for at least 6 hours prior to surgery.  No nicotine patches on the day of surgery.  Do not use any "recreational" drugs for at least a week prior to your surgery.  Please be advised that the combination of cocaine and anesthesia may have negative outcomes, up to and including death. If you test positive for cocaine, your surgery will be cancelled.  On the morning of surgery brush your teeth with toothpaste and water, you may rinse your mouth with mouthwash if you wish. Do not swallow any  toothpaste or mouthwash.  Do not wear jewelry, make-up, hairpins, clips or nail polish.  Do not wear lotions, powders, or perfumes.   Do not shave body from the neck down 48 hours prior to surgery just in case you cut yourself which could leave a site for infection.  Also, freshly shaved skin may become irritated if using the CHG soap.  Contact lenses, hearing aids and dentures may not be worn into surgery.  Do not bring valuables to the hospital. [facility] is not responsible for any missing/lost belongings or valuables.   Notify your doctor if there is any change in your medical condition (cold, fever, infection).  Wear comfortable clothing (specific to your surgery type) to the hospital.  After surgery, you can help prevent lung complications by doing breathing exercises.  Take deep breaths and cough every 1-2 hours. Your doctor may order a device called an Incentive Spirometer to help you take deep breaths. When coughing or sneezing, hold a pillow firmly against your incision with both hands. This is called "splinting." Doing this helps protect your incision. It also decreases belly discomfort.  If you are being admitted to the hospital overnight, leave your suitcase in the car. After surgery it may be brought to your room.  If you are being discharged the day of surgery, you will not be allowed to drive home. You will need a responsible adult (18 years or older) to drive you home and stay with you that night.   If you are  taking public transportation, you will need to have a responsible adult (18 years or older) with you. Please confirm with your physician that it is acceptable to use public transportation.   Please call the Pre-admissions Testing Dept. at [phone] if you have any questions about these instructions.  Surgery Visitation Policy:  Patients undergoing a surgery or procedure may have two family members or support persons with them as long as the person is not  COVID-19 positive or experiencing its symptoms.   Inpatient Visitation:    Visiting hours are 7 a.m. to 8 p.m. Up to four visitors are allowed at one time in a patient room, including children. The visitors may rotate out with other people during the day. One designated support person (adult) may remain overnight.

## 2021-09-23 ENCOUNTER — Other Ambulatory Visit: Payer: Self-pay

## 2021-09-23 ENCOUNTER — Encounter: Payer: Self-pay | Admitting: General Surgery

## 2021-09-23 ENCOUNTER — Ambulatory Visit: Payer: BC Managed Care – PPO | Admitting: Certified Registered"

## 2021-09-23 ENCOUNTER — Ambulatory Visit
Admission: RE | Admit: 2021-09-23 | Discharge: 2021-09-23 | Disposition: A | Discharge status: Home / Self Care | Payer: BC Managed Care – PPO | Source: Ambulatory Visit | Attending: General Surgery | Admitting: General Surgery

## 2021-09-23 ENCOUNTER — Encounter
Admission: RE | Disposition: A | Discharge status: Home / Self Care | Payer: Self-pay | Source: Ambulatory Visit | Attending: General Surgery

## 2021-09-23 DIAGNOSIS — F17210 Nicotine dependence, cigarettes, uncomplicated: Secondary | ICD-10-CM | POA: Diagnosis not present

## 2021-09-23 DIAGNOSIS — K8010 Calculus of gallbladder with chronic cholecystitis without obstruction: Secondary | ICD-10-CM | POA: Diagnosis present

## 2021-09-23 DIAGNOSIS — E785 Hyperlipidemia, unspecified: Secondary | ICD-10-CM | POA: Diagnosis not present

## 2021-09-23 DIAGNOSIS — I10 Essential (primary) hypertension: Secondary | ICD-10-CM | POA: Insufficient documentation

## 2021-09-23 DIAGNOSIS — K2100 Gastro-esophageal reflux disease with esophagitis, without bleeding: Secondary | ICD-10-CM | POA: Insufficient documentation

## 2021-09-23 SURGERY — CHOLECYSTECTOMY, ROBOT-ASSISTED, LAPAROSCOPIC
Anesthesia: General | Site: Abdomen

## 2021-09-23 MED ORDER — CHLORHEXIDINE GLUCONATE 0.12 % MT SOLN
OROMUCOSAL | Status: AC
  Filled 2021-09-23: qty 15

## 2021-09-23 MED ORDER — DEXAMETHASONE SODIUM PHOSPHATE 10 MG/ML IJ SOLN
INTRAMUSCULAR | Status: AC
  Filled 2021-09-23: qty 1

## 2021-09-23 MED ORDER — DROPERIDOL 2.5 MG/ML IJ SOLN: 0.6250 mg | Freq: Once | INTRAMUSCULAR | Status: DC | PRN

## 2021-09-23 MED ORDER — ROCURONIUM BROMIDE 10 MG/ML (PF) SYRINGE
PREFILLED_SYRINGE | INTRAVENOUS | Status: AC
  Filled 2021-09-23: qty 10

## 2021-09-23 MED ORDER — FENTANYL CITRATE (PF) 100 MCG/2ML IJ SOLN
25.0000 ug | INTRAMUSCULAR | Status: DC | PRN
  Administered 2021-09-23 (×2): 50 ug via INTRAVENOUS

## 2021-09-23 MED ORDER — ACETAMINOPHEN 10 MG/ML IV SOLN
INTRAVENOUS | Status: DC | PRN
  Administered 2021-09-23: 1000 mg via INTRAVENOUS

## 2021-09-23 MED ORDER — BUPIVACAINE-EPINEPHRINE (PF) 0.25% -1:200000 IJ SOLN
INTRAMUSCULAR | Status: AC
  Filled 2021-09-23: qty 30

## 2021-09-23 MED ORDER — KETOROLAC TROMETHAMINE 30 MG/ML IJ SOLN
INTRAMUSCULAR | Status: DC | PRN
  Administered 2021-09-23: 30 mg via INTRAVENOUS

## 2021-09-23 MED ORDER — GLYCOPYRROLATE 0.2 MG/ML IJ SOLN
INTRAMUSCULAR | Status: DC | PRN
  Administered 2021-09-23: .2 mg via INTRAVENOUS

## 2021-09-23 MED ORDER — SUGAMMADEX SODIUM 200 MG/2ML IV SOLN
INTRAVENOUS | Status: DC | PRN
  Administered 2021-09-23: 200 mg via INTRAVENOUS

## 2021-09-23 MED ORDER — ONDANSETRON HCL 4 MG/2ML IJ SOLN
INTRAMUSCULAR | Status: DC | PRN
  Administered 2021-09-23: 4 mg via INTRAVENOUS

## 2021-09-23 MED ORDER — OXYCODONE HCL 5 MG PO TABS
5.0000 mg | ORAL_TABLET | Freq: Once | ORAL | Status: AC | PRN
  Administered 2021-09-23: 5 mg via ORAL

## 2021-09-23 MED ORDER — MIDAZOLAM HCL 2 MG/2ML IJ SOLN
INTRAMUSCULAR | Status: AC
  Filled 2021-09-23: qty 2

## 2021-09-23 MED ORDER — FENTANYL CITRATE (PF) 100 MCG/2ML IJ SOLN
INTRAMUSCULAR | Status: AC
  Administered 2021-09-23: 50 ug via INTRAVENOUS
  Filled 2021-09-23: qty 2

## 2021-09-23 MED ORDER — FENTANYL CITRATE (PF) 100 MCG/2ML IJ SOLN
INTRAMUSCULAR | Status: DC | PRN
  Administered 2021-09-23 (×2): 50 ug via INTRAVENOUS

## 2021-09-23 MED ORDER — PROPOFOL 10 MG/ML IV BOLUS
INTRAVENOUS | Status: DC | PRN
  Administered 2021-09-23: 170 mg via INTRAVENOUS
  Administered 2021-09-23: 30 mg via INTRAVENOUS

## 2021-09-23 MED ORDER — ONDANSETRON HCL 4 MG/2ML IJ SOLN
INTRAMUSCULAR | Status: AC
  Filled 2021-09-23: qty 2

## 2021-09-23 MED ORDER — DEXMEDETOMIDINE (PRECEDEX) IN NS 20 MCG/5ML (4 MCG/ML) IV SYRINGE
PREFILLED_SYRINGE | INTRAVENOUS | Status: DC | PRN
  Administered 2021-09-23 (×2): 8 ug via INTRAVENOUS

## 2021-09-23 MED ORDER — OXYCODONE HCL 5 MG/5ML PO SOLN: 5.0000 mg | Freq: Once | ORAL | Status: AC | PRN

## 2021-09-23 MED ORDER — OXYCODONE-ACETAMINOPHEN 5-325 MG PO TABS: 1.0000 | ORAL_TABLET | ORAL | 0 refills | Status: AC | PRN

## 2021-09-23 MED ORDER — ACETAMINOPHEN 10 MG/ML IV SOLN
INTRAVENOUS | Status: AC
  Filled 2021-09-23: qty 100

## 2021-09-23 MED ORDER — ACETAMINOPHEN 10 MG/ML IV SOLN: 1000.0000 mg | Freq: Once | INTRAVENOUS | Status: DC | PRN

## 2021-09-23 MED ORDER — BUPIVACAINE-EPINEPHRINE 0.25% -1:200000 IJ SOLN
INTRAMUSCULAR | Status: DC | PRN
  Administered 2021-09-23: 30 mL

## 2021-09-23 MED ORDER — FENTANYL CITRATE (PF) 100 MCG/2ML IJ SOLN
INTRAMUSCULAR | Status: AC
  Filled 2021-09-23: qty 2

## 2021-09-23 MED ORDER — DEXAMETHASONE SODIUM PHOSPHATE 10 MG/ML IJ SOLN
INTRAMUSCULAR | Status: DC | PRN
  Administered 2021-09-23: 10 mg via INTRAVENOUS

## 2021-09-23 MED ORDER — PROMETHAZINE HCL 25 MG/ML IJ SOLN: 6.2500 mg | INTRAMUSCULAR | Status: DC | PRN

## 2021-09-23 MED ORDER — OXYCODONE HCL 5 MG PO TABS
ORAL_TABLET | ORAL | Status: AC
  Filled 2021-09-23: qty 1

## 2021-09-23 MED ORDER — LIDOCAINE HCL (CARDIAC) PF 100 MG/5ML IV SOSY
PREFILLED_SYRINGE | INTRAVENOUS | Status: DC | PRN
  Administered 2021-09-23: 60 mg via INTRAVENOUS

## 2021-09-23 MED ORDER — KETOROLAC TROMETHAMINE 30 MG/ML IJ SOLN
INTRAMUSCULAR | Status: AC
  Filled 2021-09-23: qty 1

## 2021-09-23 MED ORDER — DEXMEDETOMIDINE HCL IN NACL 80 MCG/20ML IV SOLN
INTRAVENOUS | Status: AC
  Filled 2021-09-23: qty 20

## 2021-09-23 MED ORDER — ROCURONIUM BROMIDE 100 MG/10ML IV SOLN
INTRAVENOUS | Status: DC | PRN
  Administered 2021-09-23: 60 mg via INTRAVENOUS
  Administered 2021-09-23 (×2): 10 mg via INTRAVENOUS

## 2021-09-23 MED ORDER — MIDAZOLAM HCL 2 MG/2ML IJ SOLN
INTRAMUSCULAR | Status: DC | PRN
  Administered 2021-09-23: 2 mg via INTRAVENOUS

## 2021-09-23 MED ORDER — CEFAZOLIN SODIUM-DEXTROSE 2-4 GM/100ML-% IV SOLN
INTRAVENOUS | Status: AC
  Filled 2021-09-23: qty 100

## 2021-09-23 SURGICAL SUPPLY — 54 items
BAG PRESSURE INF REUSE 1000 (BAG) IMPLANT
BLADE SURG SZ11 CARB STEEL (BLADE) ×3 IMPLANT
CANNULA REDUC XI 12-8 STAPL (CANNULA) ×1
CANNULA REDUCER 12-8 DVNC XI (CANNULA) ×2 IMPLANT
CATH REDDICK CHOLANGI 4FR 50CM (CATHETERS) IMPLANT
CLIP LIGATING HEM O LOK PURPLE (MISCELLANEOUS) IMPLANT
CLIP LIGATING HEMO O LOK GREEN (MISCELLANEOUS) ×3 IMPLANT
DERMABOND ADVANCED (GAUZE/BANDAGES/DRESSINGS) ×1
DERMABOND ADVANCED .7 DNX12 (GAUZE/BANDAGES/DRESSINGS) ×2 IMPLANT
DRAPE ARM DVNC X/XI (DISPOSABLE) ×8 IMPLANT
DRAPE C-ARM XRAY 36X54 (DRAPES) IMPLANT
DRAPE COLUMN DVNC XI (DISPOSABLE) ×2 IMPLANT
DRAPE DA VINCI XI ARM (DISPOSABLE) ×4
DRAPE DA VINCI XI COLUMN (DISPOSABLE) ×1
ELECT REM PT RETURN 9FT ADLT (ELECTROSURGICAL) ×3
ELECTRODE REM PT RTRN 9FT ADLT (ELECTROSURGICAL) ×2 IMPLANT
GLOVE BIO SURGEON STRL SZ 6.5 (GLOVE) ×8 IMPLANT
GLOVE BIOGEL PI IND STRL 6.5 (GLOVE) ×4 IMPLANT
GLOVE BIOGEL PI INDICATOR 6.5 (GLOVE) ×4
GOWN STRL REUS W/ TWL LRG LVL3 (GOWN DISPOSABLE) ×6 IMPLANT
GOWN STRL REUS W/TWL LRG LVL3 (GOWN DISPOSABLE) ×4
GRASPER SUT TROCAR 14GX15 (MISCELLANEOUS) ×3 IMPLANT
IRRIGATOR SUCT 8 DISP DVNC XI (IRRIGATION / IRRIGATOR) IMPLANT
IRRIGATOR SUCTION 8MM XI DISP (IRRIGATION / IRRIGATOR)
IV CATH ANGIO 12GX3 LT BLUE (NEEDLE) IMPLANT
IV NS 1000ML (IV SOLUTION)
IV NS 1000ML BAXH (IV SOLUTION) IMPLANT
KIT PINK PAD W/HEAD ARE REST (MISCELLANEOUS) ×3
KIT PINK PAD W/HEAD ARM REST (MISCELLANEOUS) ×2 IMPLANT
LABEL OR SOLS (LABEL) ×3 IMPLANT
MANIFOLD NEPTUNE II (INSTRUMENTS) ×3 IMPLANT
NDL INSUFFLATION 14GA 120MM (NEEDLE) ×2 IMPLANT
NEEDLE HYPO 22GX1.5 SAFETY (NEEDLE) ×3 IMPLANT
NEEDLE INSUFFLATION 14GA 120MM (NEEDLE) ×3 IMPLANT
NS IRRIG 500ML POUR BTL (IV SOLUTION) ×3 IMPLANT
OBTURATOR OPTICAL STANDARD 8MM (TROCAR) ×1
OBTURATOR OPTICAL STND 8 DVNC (TROCAR) ×2
OBTURATOR OPTICALSTD 8 DVNC (TROCAR) ×2 IMPLANT
PACK LAP CHOLECYSTECTOMY (MISCELLANEOUS) ×3 IMPLANT
SEAL CANN UNIV 5-8 DVNC XI (MISCELLANEOUS) ×6 IMPLANT
SEAL XI 5MM-8MM UNIVERSAL (MISCELLANEOUS) ×3
SET TUBE SMOKE EVAC HIGH FLOW (TUBING) ×3 IMPLANT
SOLUTION ELECTROLUBE (MISCELLANEOUS) ×3 IMPLANT
SPIKE FLUID TRANSFER (MISCELLANEOUS) ×6 IMPLANT
SPONGE T-LAP 4X18 ~~LOC~~+RFID (SPONGE) IMPLANT
STAPLER CANNULA SEAL DVNC XI (STAPLE) ×2 IMPLANT
STAPLER CANNULA SEAL XI (STAPLE) ×1
SUT MNCRL 4-0 (SUTURE) ×1
SUT MNCRL 4-0 27XMFL (SUTURE) ×2
SUT VICRYL 0 AB UR-6 (SUTURE) ×3 IMPLANT
SUTURE MNCRL 4-0 27XMF (SUTURE) ×2 IMPLANT
SYS BAG RETRIEVAL 10MM (BASKET) ×3
SYSTEM BAG RETRIEVAL 10MM (BASKET) ×2 IMPLANT
WATER STERILE IRR 500ML POUR (IV SOLUTION) ×2 IMPLANT

## 2021-09-23 NOTE — Anesthesia Postprocedure Evaluation (Signed)
Anesthesia Post Note  Patient: Raymond Hamilton  Procedure(s) Performed: XI ROBOTIC ASSISTED LAPAROSCOPIC CHOLECYSTECTOMY (Abdomen) INDOCYANINE GREEN FLUORESCENCE IMAGING (ICG)  Patient location during evaluation: PACU Anesthesia Type: General Level of consciousness: awake and alert Pain management: pain level controlled Vital Signs Assessment: post-procedure vital signs reviewed and stable Respiratory status: spontaneous breathing, nonlabored ventilation and respiratory function stable Cardiovascular status: blood pressure returned to baseline and stable Postop Assessment: no apparent nausea or vomiting Anesthetic complications: no   No notable events documented.   Last Vitals:  Vitals:   09/23/21 1046 09/23/21 1059  BP: 122/81 (!) 159/89  Pulse: (!) 51 78  Resp: 14 16  Temp: (!) 36.2 C (!) 36.1 C  SpO2: 93% 98%    Last Pain:  Vitals:   09/23/21 1108  TempSrc:   PainSc: 6                  Foye Deer

## 2021-09-23 NOTE — Op Note (Signed)
Preoperative diagnosis: Cholelithiasis  Postoperative diagnosis: Cholelithiasis   Procedure: Robotic Assisted Laparoscopic Cholecystectomy.   Anesthesia: GETA   Surgeon: Dr. Hazle Quant  Wound Classification: Clean Contaminated  Indications: Patient is a 51 y.o. male developed right upper quadrant/epigastric pain and on workup was found to have cholelithiasis with a normal common duct. Robotic Assisted Laparoscopic cholecystectomy was elected.  Findings:  Critical view of safety achieved Cystic duct and artery identified, ligated and divided Adequate hemostasis     Description of procedure: The patient was placed on the operating table in the supine position. General anesthesia was induced. A time-out was completed verifying correct patient, procedure, site, positioning, and implant(s) and/or special equipment prior to beginning this procedure. An orogastric tube was placed. The abdomen was prepped and draped in the usual sterile fashion.  An incision was made in a natural skin line below the umbilicus.  The fascia was elevated and the Veress needle inserted. Proper position was confirmed by aspiration and saline meniscus test.  The abdomen was insufflated with carbon dioxide to a pressure of 15 mmHg. The patient tolerated insufflation well. A 8-mm trocar was then inserted in optiview fashion.  The laparoscope was inserted and the abdomen inspected. No injuries from initial trocar placement were noted. Additional trocars were then inserted in the following locations: an 8-mm trocar in the left lateral abdomen, and another two 8-mm trocars to the right side of the abdomen 5 cm appart. The umbilical trocar was changed to a 12 mm trocar all under direct visualization. The abdomen was inspected and no abnormalities were found. The table was placed in the reverse Trendelenburg position with the right side up. The robotic arms were docked and target anatomy identified. Instrument inserted under  direct visualization.  Filmy adhesions between the gallbladder and omentum, duodenum and transverse colon were lysed with electrocautery. The dome of the gallbladder was grasped with a prograsp and retracted over the dome of the liver. The infundibulum was also grasped with an atraumatic grasper and retracted toward the right lower quadrant. This maneuver exposed Calot's triangle. The peritoneum overlying the gallbladder infundibulum was then incised and the cystic duct and cystic artery identified and circumferentially dissected. Critical view of safety reviewed before ligating any structure. Firefly images taken to visualize biliary ducts. The cystic duct and cystic artery were then doubly clipped and divided close to the gallbladder.  The gallbladder was then dissected from its peritoneal attachments by electrocautery. Hemostasis was checked and the gallbladder and contained stones were removed using an endoscopic retrieval bag. The gallbladder was passed off the table as a specimen. There was no evidence of bleeding from the gallbladder fossa or cystic artery or leakage of the bile from the cystic duct stump. Secondary trocars were removed under direct vision. No bleeding was noted. The robotic arms were undoked. The scope was withdrawn and the umbilical trocar removed. The abdomen was allowed to collapse. The fascia of the 82mm trocar sites was closed with figure-of-eight 0 vicryl sutures. The skin was closed with subcuticular sutures of 4-0 monocryl and topical skin adhesive. The orogastric tube was removed.  The patient tolerated the procedure well and was taken to the postanesthesia care unit in stable condition.   Specimen: Gallbladder  Complications: None  EBL: 5 mL

## 2021-09-23 NOTE — Transfer of Care (Signed)
Immediate Anesthesia Transfer of Care Note  Patient: Raymond Hamilton  Procedure(s) Performed: XI ROBOTIC ASSISTED LAPAROSCOPIC CHOLECYSTECTOMY (Abdomen) INDOCYANINE GREEN FLUORESCENCE IMAGING (ICG)  Patient Location: PACU  Anesthesia Type:General  Level of Consciousness: drowsy  Airway & Oxygen Therapy: Patient Spontanous Breathing and Patient connected to face mask oxygen  Post-op Assessment: Report given to RN and Post -op Vital signs reviewed and stable  Post vital signs: Reviewed  Last Vitals:  Vitals Value Taken Time  BP    Temp    Pulse 82 09/23/21 0956  Resp    SpO2 100 % 09/23/21 0956  Vitals shown include unvalidated device data.  Last Pain:  Vitals:   09/23/21 0714  TempSrc: Temporal  PainSc: 0-No pain         Complications: No notable events documented.

## 2021-09-23 NOTE — Anesthesia Procedure Notes (Signed)
Procedure Name: Intubation Date/Time: 09/23/2021 8:48 AM  Performed by: Malva Cogan, CRNAPre-anesthesia Checklist: Patient identified, Patient being monitored, Timeout performed, Emergency Drugs available and Suction available Patient Re-evaluated:Patient Re-evaluated prior to induction Oxygen Delivery Method: Circle system utilized Preoxygenation: Pre-oxygenation with 100% oxygen Induction Type: IV induction Ventilation: Mask ventilation without difficulty and Oral airway inserted - appropriate to patient size Laryngoscope Size: McGraph and 4 Grade View: Grade II Tube type: Oral Tube size: 7.5 mm Number of attempts: 1 Airway Equipment and Method: Stylet Placement Confirmation: ETT inserted through vocal cords under direct vision, positive ETCO2 and breath sounds checked- equal and bilateral Secured at: 23 cm Tube secured with: Tape Dental Injury: Teeth and Oropharynx as per pre-operative assessment

## 2021-09-23 NOTE — Discharge Instructions (Addendum)
  Diet: Resume home heart healthy regular diet.   Activity: No heavy lifting >20 pounds (children, pets, laundry, garbage) or strenuous activity until follow-up, but light activity and walking are encouraged. Do not drive or drink alcohol if taking narcotic pain medications.  Wound care: May shower with soapy water and pat dry (do not rub incisions), but no baths or submerging incision underwater until follow-up. (no swimming)   Medications: Resume all home medications. For mild to moderate pain: acetaminophen (Tylenol) or ibuprofen (if no kidney disease). Combining Tylenol with alcohol can substantially increase your risk of causing liver disease. Narcotic pain medications, if prescribed, can be used for severe pain, though may cause nausea, constipation, and drowsiness. If you do not need the narcotic pain medication, you do not need to fill the prescription.  Call office ([phone]) at any time if any questions, worsening pain, fevers/chills, bleeding, drainage from incision site, or other concerns.   AMBULATORY SURGERY  DISCHARGE INSTRUCTIONS   The drugs that you were given will stay in your system until tomorrow so for the next 24 hours you should not:  Drive an automobile Make any legal decisions Drink any alcoholic beverage   You may resume regular meals tomorrow.  Today it is better to start with liquids and gradually work up to solid foods.  You may eat anything you prefer, but it is better to start with liquids, then soup and crackers, and gradually work up to solid foods.   Please notify your doctor immediately if you have any unusual bleeding, trouble breathing, redness and pain at the surgery site, drainage, fever, or pain not relieved by medication.    Your post-operative visit with Dr.                                       is: Date:                        Time:    Please call to schedule your post-operative visit.  Additional Instructions: 

## 2021-09-23 NOTE — Interval H&P Note (Signed)
History and Physical Interval Note:  09/23/2021 8:09 AM  Raymond Hamilton  has presented today for surgery, with the diagnosis of K80.20 Cholelithiasis w/o cholecystitis.  The various methods of treatment have been discussed with the patient and family. After consideration of risks, benefits and other options for treatment, the patient has consented to  Procedure(s): XI ROBOTIC ASSISTED LAPAROSCOPIC CHOLECYSTECTOMY (N/A) INDOCYANINE GREEN FLUORESCENCE IMAGING (ICG) (N/A) as a surgical intervention.  The patient's history has been reviewed, patient examined, no change in status, stable for surgery.  I have reviewed the patient's chart and labs.  Questions were answered to the patient's satisfaction.     Carolan Shiver

## 2021-09-23 NOTE — Anesthesia Preprocedure Evaluation (Addendum)
Anesthesia Evaluation  Patient identified by MRN, date of birth, ID band Patient awake    Reviewed: Allergy & Precautions, NPO status , Patient's Chart, lab work & pertinent test results  Airway Mallampati: II  TM Distance: >3 FB Neck ROM: full    Dental  (+) Edentulous Upper, Edentulous Lower   Pulmonary Current Smoker and Patient abstained from smoking.,    Pulmonary exam normal        Cardiovascular Exercise Tolerance: Good hypertension, Pt. on medications Normal cardiovascular exam     Neuro/Psych negative neurological ROS  negative psych ROS   GI/Hepatic Neg liver ROS, GERD  Medicated and Controlled,cholelithiasis   Endo/Other  negative endocrine ROS  Renal/GU      Musculoskeletal   Abdominal Normal abdominal exam  (+)   Peds  Hematology negative hematology ROS (+)   Anesthesia Other Findings Past Medical History: No date: GERD (gastroesophageal reflux disease) No date: HLD (hyperlipidemia) No date: Hypertension No date: Neck pain No date: Pneumonia  Past Surgical History: 2022: COLONOSCOPY W/ POLYPECTOMY 2023: EGD dilitation of esophagus 09/19/2018: ESOPHAGOGASTRODUODENOSCOPY (EGD) WITH PROPOFOL; N/A     Comment:  Procedure: ESOPHAGOGASTRODUODENOSCOPY (EGD) WITH               PROPOFOL;  Surgeon: Midge Minium, MD;  Location: [facility]               ENDOSCOPY;  Service: Endoscopy;  Laterality: N/A; No date: TUMOR EXCISION  BMI    Body Mass Index: 22.83 kg/m      Reproductive/Obstetrics negative OB ROS                            Anesthesia Physical Anesthesia Plan  ASA: 2  Anesthesia Plan: General ETT   Post-op Pain Management: Toradol IV (intra-op)* and Ofirmev IV (intra-op)*   Induction: Intravenous  PONV Risk Score and Plan: 2 and Ondansetron, Dexamethasone and Midazolam  Airway Management Planned: Oral ETT  Additional Equipment:   Intra-op Plan:    Post-operative Plan: Extubation in OR  Informed Consent: I have reviewed the patients History and Physical, chart, labs and discussed the procedure including the risks, benefits and alternatives for the proposed anesthesia with the patient or authorized representative who has indicated his/her understanding and acceptance.     Dental Advisory Given  Plan Discussed with: Anesthesiologist, CRNA and Surgeon  Anesthesia Plan Comments:         Anesthesia Quick Evaluation

## 2021-09-24 LAB — SURGICAL PATHOLOGY

## 2021-09-27 ENCOUNTER — Other Ambulatory Visit: Payer: Self-pay

## 2021-09-27 DIAGNOSIS — Z5321 Procedure and treatment not carried out due to patient leaving prior to being seen by health care provider: Secondary | ICD-10-CM | POA: Insufficient documentation

## 2021-09-27 DIAGNOSIS — Y69 Unspecified misadventure during surgical and medical care: Secondary | ICD-10-CM | POA: Diagnosis not present

## 2021-09-27 DIAGNOSIS — T8189XA Other complications of procedures, not elsewhere classified, initial encounter: Secondary | ICD-10-CM | POA: Insufficient documentation

## 2021-09-27 DIAGNOSIS — Z9049 Acquired absence of other specified parts of digestive tract: Secondary | ICD-10-CM | POA: Insufficient documentation

## 2021-09-27 LAB — COMPREHENSIVE METABOLIC PANEL
ALT: 27 U/L (ref 0–44)
AST: 26 U/L (ref 15–41)
Albumin: 4.1 g/dL (ref 3.5–5.0)
Alkaline Phosphatase: 76 U/L (ref 38–126)
Anion gap: 11 (ref 5–15)
BUN: 8 mg/dL (ref 6–20)
CO2: 22 mmol/L (ref 22–32)
Calcium: 9.3 mg/dL (ref 8.9–10.3)
Chloride: 99 mmol/L (ref 98–111)
Creatinine, Ser: 0.77 mg/dL (ref 0.61–1.24)
GFR, Estimated: >60 mL/min (ref >60)
Glucose, Bld: 130 mg/dL — ABNORMAL HIGH (ref 70–99)
Potassium: 3.2 mmol/L — ABNORMAL LOW (ref 3.5–5.1)
Sodium: 132 mmol/L — ABNORMAL LOW (ref 135–145)
Total Bilirubin: 0.8 mg/dL (ref 0.3–1.2)
Total Protein: 7.3 g/dL (ref 6.5–8.1)

## 2021-09-27 LAB — CBC
HCT: 45.4 % (ref 39.0–52.0)
Hemoglobin: 16.3 g/dL (ref 13.0–17.0)
MCH: 35.1 pg — ABNORMAL HIGH (ref 26.0–34.0)
MCHC: 35.9 g/dL (ref 30.0–36.0)
MCV: 97.6 fL (ref 80.0–100.0)
Platelets: 174 10*3/uL (ref 150–400)
RBC: 4.65 MIL/uL (ref 4.22–5.81)
RDW: 12.2 % (ref 11.5–15.5)
WBC: 10.9 10*3/uL — ABNORMAL HIGH (ref 4.0–10.5)
nRBC: 0 % (ref 0.0–0.2)

## 2021-09-28 ENCOUNTER — Emergency Department
Admission: EM | Admit: 2021-09-28 | Discharge: 2021-09-28 | Discharge status: Against Medical Advice | Payer: BC Managed Care – PPO | Attending: Emergency Medicine | Admitting: Emergency Medicine

## 2022-08-31 ENCOUNTER — Emergency Department: Payer: BC Managed Care – PPO

## 2022-08-31 ENCOUNTER — Other Ambulatory Visit: Payer: Self-pay

## 2022-08-31 ENCOUNTER — Encounter: Payer: Self-pay | Admitting: Emergency Medicine

## 2022-08-31 ENCOUNTER — Emergency Department
Admission: EM | Admit: 2022-08-31 | Discharge: 2022-08-31 | Disposition: A | Discharge status: Home / Self Care | Payer: BC Managed Care – PPO | Attending: Emergency Medicine | Admitting: Emergency Medicine

## 2022-08-31 DIAGNOSIS — R55 Syncope and collapse: Secondary | ICD-10-CM | POA: Diagnosis present

## 2022-08-31 LAB — URINALYSIS, ROUTINE W REFLEX MICROSCOPIC
Bilirubin Urine: NEGATIVE
Glucose, UA: NEGATIVE mg/dL
Hgb urine dipstick: NEGATIVE
Ketones, ur: NEGATIVE mg/dL
Leukocytes,Ua: NEGATIVE
Nitrite: NEGATIVE
Protein, ur: NEGATIVE mg/dL
Specific Gravity, Urine: 1.002 — ABNORMAL LOW (ref 1.005–1.030)
pH: 6 (ref 5.0–8.0)

## 2022-08-31 LAB — CBC
HCT: 47.5 % (ref 39.0–52.0)
Hemoglobin: 16.7 g/dL (ref 13.0–17.0)
MCH: 34.9 pg — ABNORMAL HIGH (ref 26.0–34.0)
MCHC: 35.2 g/dL (ref 30.0–36.0)
MCV: 99.4 fL (ref 80.0–100.0)
Platelets: 177 10*3/uL (ref 150–400)
RBC: 4.78 MIL/uL (ref 4.22–5.81)
RDW: 12 % (ref 11.5–15.5)
WBC: 11.3 10*3/uL — ABNORMAL HIGH (ref 4.0–10.5)
nRBC: 0 % (ref 0.0–0.2)

## 2022-08-31 LAB — BASIC METABOLIC PANEL
Anion gap: 10 (ref 5–15)
BUN: 6 mg/dL (ref 6–20)
CO2: 25 mmol/L (ref 22–32)
Calcium: 9.1 mg/dL (ref 8.9–10.3)
Chloride: 100 mmol/L (ref 98–111)
Creatinine, Ser: 0.83 mg/dL (ref 0.61–1.24)
GFR, Estimated: >60 mL/min (ref >60)
Glucose, Bld: 127 mg/dL — ABNORMAL HIGH (ref 70–99)
Potassium: 3.9 mmol/L (ref 3.5–5.1)
Sodium: 135 mmol/L (ref 135–145)

## 2022-08-31 LAB — D-DIMER, QUANTITATIVE: D-Dimer, Quant: 0.5 ug/mL-FEU (ref 0.00–0.50)

## 2022-08-31 LAB — TROPONIN I (HIGH SENSITIVITY): Troponin I (High Sensitivity): 4 ng/L (ref <18)

## 2022-08-31 NOTE — ED Notes (Signed)
Pt had syncopal episode at work today about 2 hours ago. Pt denies CP, SOB, dizziness at this time.

## 2022-08-31 NOTE — Discharge Instructions (Signed)
You were seen in the emergency department after passing out (syncope). Please arrange follow-up with a primary care provider in the next few days for further evaluation of your symptoms. Avoid diving, swimming alone, climbing or other activities from a height until cleared by your doctor. Return to the ER immediately if you develop chest pain, shortness of breath, it feels like your heart is racing, repeated episodes, or other new or concerning symptoms.

## 2022-08-31 NOTE — ED Notes (Signed)
Trop sent with d dimer.

## 2022-08-31 NOTE — ED Triage Notes (Signed)
Patient to ED via Pov for syncopal episode. Patient states he was working on fridge when this occurred. Was caught by co-workers before hitting the ground. C/o of upper abd pain.

## 2022-08-31 NOTE — ED Provider Notes (Signed)
[facility] Provider Note    Event Date/Time   First MD Initiated Contact with Patient 08/31/22 1413     (approximate)   History   Loss of Consciousness   HPI  Raymond Hamilton is a 52 y.o. male presenting to the emergency department for evaluation following a syncopal episode.  This morning, patient was at work and was about to try and fix a refrigerator.  He went to look up, felt lightheaded, and had a witnessed syncopal episode.  He was caught by his coworkers and did not strike his head.  Not on anticoagulation.  Few hours later, he noticed that he did have some mild chest discomfort described as a pressure in the center of his chest, nonradiating.      Physical Exam   Triage Vital Signs: ED Triage Vitals [08/31/22 1219]  Enc Vitals Group     BP (!) 159/90     Pulse Rate 85     Resp 18     Temp 97.8 F (36.6 C)     Temp Source Oral     SpO2 97 %     Weight      Height      Head Circumference      Peak Flow      Pain Score 4     Pain Loc      Pain Edu?      Excl. in GC?     Most recent vital signs: Vitals:   08/31/22 1530 08/31/22 1535  BP:  (!) 145/87  Pulse: 75 84  Resp: 18 19  Temp:    SpO2: 96% 96%     General: Awake, interactive  CV:  Regular rate, good peripheral perfusion.  Resp:  Lungs clear, unlabored respirations.  Abd:  Soft, nondistended.  Neuro:  Alert and oriented, normal extraocular movements, symmetric facial movement, sensation intact over bilateral upper and lower extremities with 5 out of 5 strength.  ED Results / Procedures / Treatments   Labs (all labs ordered are listed, but only abnormal results are displayed) Labs Reviewed  BASIC METABOLIC PANEL - Abnormal; Notable for the following components:      Result Value   Glucose, Bld 127 (*)    All other components within normal limits  CBC - Abnormal; Notable for the following components:   WBC 11.3 (*)    MCH 34.9 (*)    All other components within  normal limits  URINALYSIS, ROUTINE W REFLEX MICROSCOPIC - Abnormal; Notable for the following components:   Color, Urine STRAW (*)    APPearance CLEAR (*)    Specific Gravity, Urine 1.002 (*)    All other components within normal limits  D-DIMER, QUANTITATIVE  CBG MONITORING, ED  TROPONIN I (HIGH SENSITIVITY)     EKG EKG independently reviewed interpreted by myself (ER attending) demonstrates:  EKG demonstrates normal sinus rhythm at a rate of 84, PR 158, QRS 96, QTc 460, no acute ST changes  RADIOLOGY Imaging independently reviewed and interpreted by myself demonstrates:  Chest x-Delmy Holdren without evidence of pneumonia or pneumothorax  PROCEDURES:  Critical Care performed: No  Procedures   MEDICATIONS ORDERED IN ED: Medications - No data to display   IMPRESSION / MDM / ASSESSMENT AND PLAN / ED COURSE  I reviewed the triage vital signs and the nursing notes.  Differential diagnosis includes, but is not limited to, pneumothorax, PE, ACS, vasovagal syncope, arrhythmia  Patient's presentation is most consistent with acute presentation with  potential threat to life or bodily function.  52 year old male presenting following a syncopal episode, later developed chest pain.  Workup here including labs, EKG, troponin (obtained >3 hours after onset of pain), chest x-Nyzier Boivin overall reassuring.  Patient has had normal sinus rhythm with stable vitals here.  Overall low suspicion for significant acute pathology.  Patient reports that he has follow-up with his primary care doctor arranged for tomorrow.  Do think he is stable for discharge home.  Patient follow-up as planned.  Strict return precautions provided.  Patient discharged stable condition.      FINAL CLINICAL IMPRESSION(S) / ED DIAGNOSES   Final diagnoses:  Syncope and collapse     Rx / DC Orders   ED Discharge Orders     None        Note:  This document was prepared using Dragon voice recognition software and may include  unintentional dictation errors.   Trinna Post, MD 08/31/22 [phone]

## 2023-05-12 ENCOUNTER — Ambulatory Visit: Payer: BC Managed Care – PPO | Admitting: Dermatology

## 2023-06-23 ENCOUNTER — Encounter: Payer: Self-pay | Admitting: Dermatology

## 2023-06-23 ENCOUNTER — Ambulatory Visit: Payer: BC Managed Care – PPO | Admitting: Dermatology

## 2023-06-23 DIAGNOSIS — B078 Other viral warts: Secondary | ICD-10-CM | POA: Diagnosis not present

## 2023-06-23 DIAGNOSIS — Z7189 Other specified counseling: Secondary | ICD-10-CM | POA: Diagnosis not present

## 2023-06-23 MED ORDER — CALCIPOTRIENE 0.005 % EX CREA: TOPICAL_CREAM | CUTANEOUS | 1 refills | Status: DC

## 2023-06-23 MED ORDER — FLUOROURACIL 5 % EX CREA
TOPICAL_CREAM | CUTANEOUS | 1 refills | Status: AC
Start: 2023-06-23 — End: ?

## 2023-06-23 NOTE — Patient Instructions (Addendum)

## 2023-06-23 NOTE — Progress Notes (Signed)
   New Patient Visit   Subjective  Raymond Hamilton is a 53 y.o. male who presents for the following: Patient c/o a growth on the left 2nd finger for several months, treated with Triamcinolone cream.  The patient has spots, moles and lesions to be evaluated, some may be new or changing and the patient may have concern these could be cancer.  The following portions of the chart were reviewed this encounter and updated as appropriate: medications, allergies, medical history  Review of Systems:  No other skin or systemic complaints except as noted in HPI or Assessment and Plan.  Objective  Well appearing patient in no apparent distress; mood and affect are within normal limits.  A focused examination was performed of the following areas: left 2nd finger   Relevant exam findings are noted in the Assessment and Plan.  left 2nd finger Verrucous plaque -- Discussed viral etiology and contagion.      Assessment & Plan   OTHER VIRAL WARTS left 2nd finger Viral Wart (HPV) Counseling  Discussed viral / HPV (Human Papilloma Virus) etiology and risk of spread /infectivity to other areas of body as well as to other people.  Multiple treatments and methods may be required to clear warts and it is possible treatment may not be successful.  Treatment risks include discoloration; scarring and there is still potential for wart recurrence.   May consider biopsy if no better at the next office visit  Discussed there is some relatively low risk for HPV to have malignant transformation. In 4 month  Start Calcipotriene cream apply to affected skin once a day  Start Efudex 5% cream apply to affected skin once a day Destruction of lesion - left 2nd finger Complexity: simple   Destruction method: cryotherapy   Informed consent: discussed and consent obtained   Timeout:  patient name, date of birth, surgical site, and procedure verified Lesion destroyed using liquid nitrogen: Yes   Region frozen until  ice ball extended beyond lesion: Yes   Outcome: patient tolerated procedure well with no complications   Post-procedure details: wound care instructions given     Return in about 4 months (around 10/23/2023) for warts .  IAngelique Holm, CMA, am acting as scribe for Armida Sans, MD .   Documentation: I have reviewed the above documentation for accuracy and completeness, and I agree with the above.  Armida Sans, MD

## 2023-06-27 ENCOUNTER — Telehealth: Payer: Self-pay

## 2023-06-27 MED ORDER — CALCIPOTRIENE 0.005 % EX OINT: TOPICAL_OINTMENT | Freq: Every day | CUTANEOUS | 0 refills | Status: AC

## 2023-06-27 NOTE — Telephone Encounter (Signed)
 Insurance does not cover Calcipotriene Cream, ointment preferred. aw

## 2023-10-25 ENCOUNTER — Ambulatory Visit: Admitting: Dermatology

## 2024-02-18 IMAGING — US US ABDOMEN LIMITED
1 series · 14 of 25 positions shown · non-contrast
Comparison: CT 07/01/2021

CLINICAL DATA: Right upper quadrant pain for 5 days

EXAM:
ULTRASOUND ABDOMEN LIMITED RIGHT UPPER QUADRANT

[Series 1: us abdomen limited · 0.19mm/px · 14 of 58 slices shown]
[im 1/58]
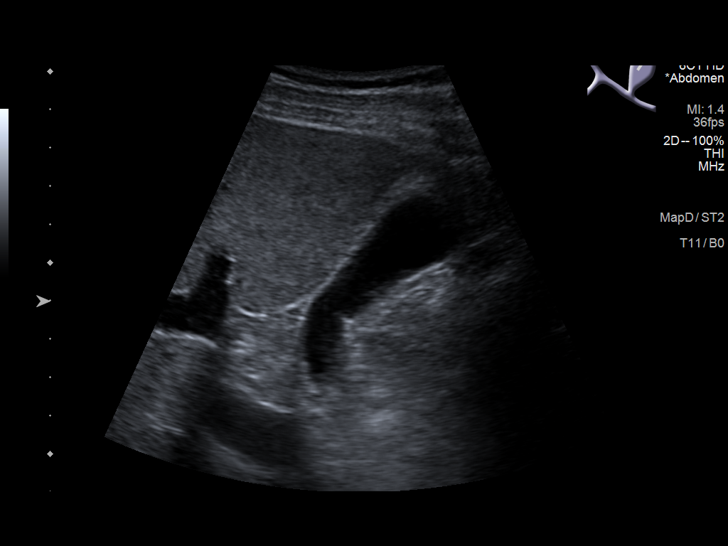
[im 5/58]
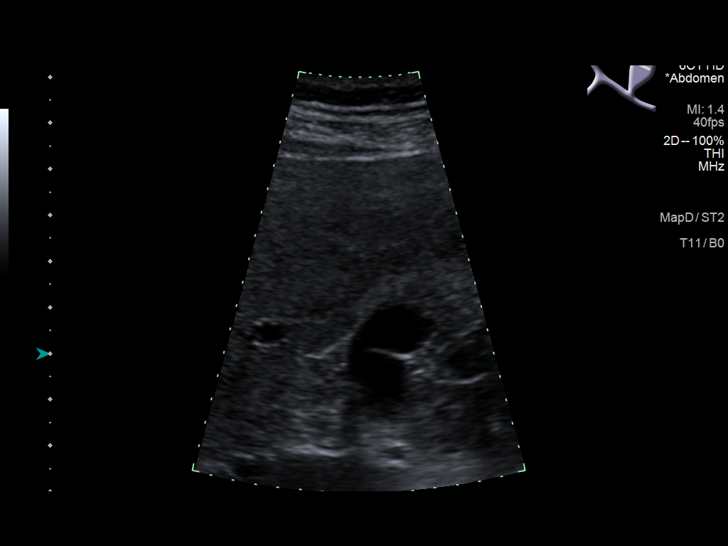
[im 10/58]
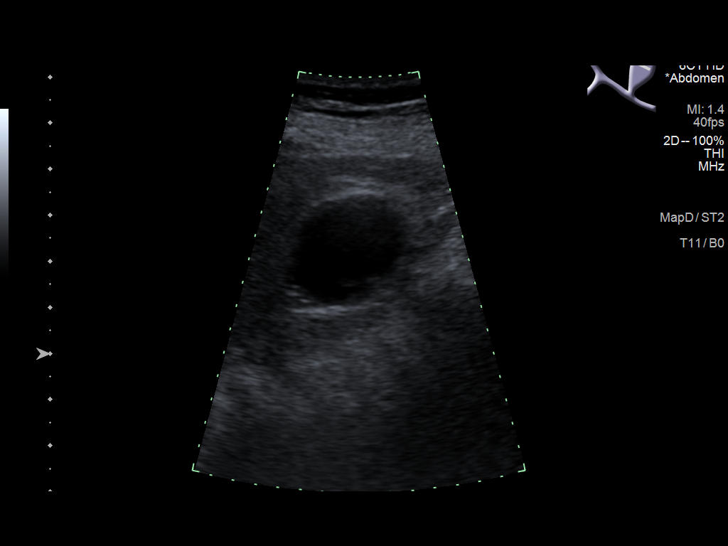
[im 15/58]
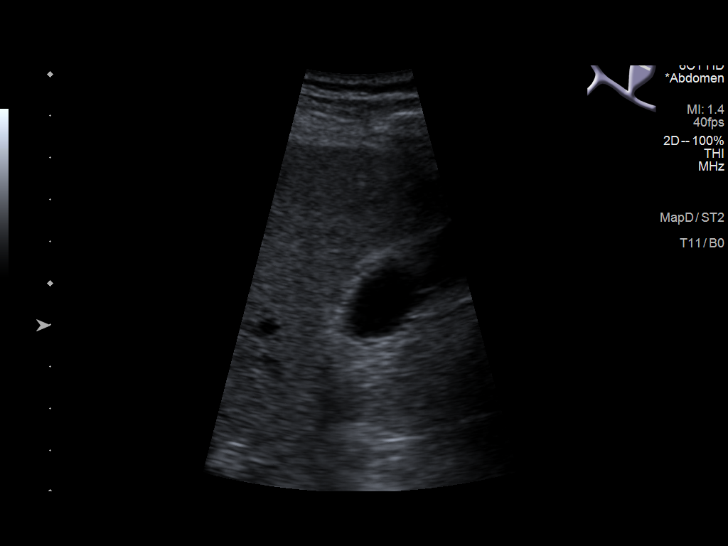
[im 20/58]
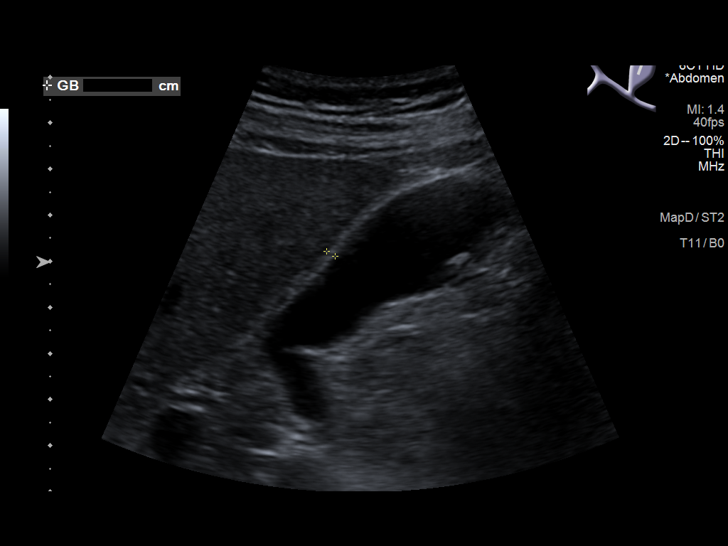
[im 22/58]
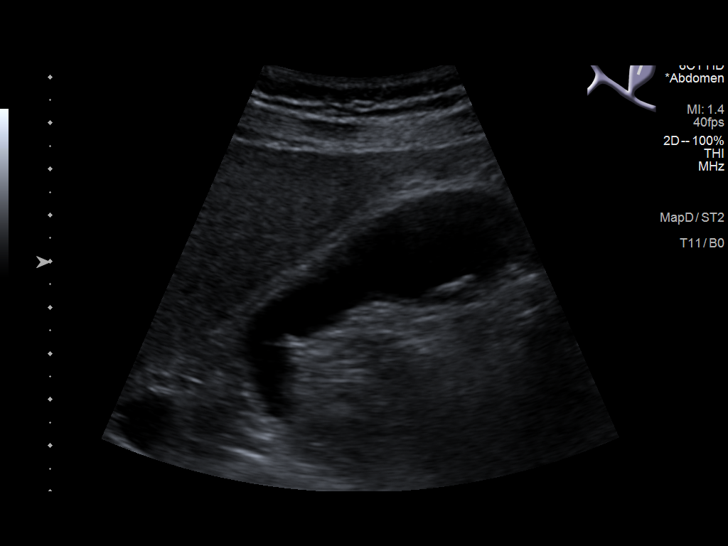
[im 27/58]
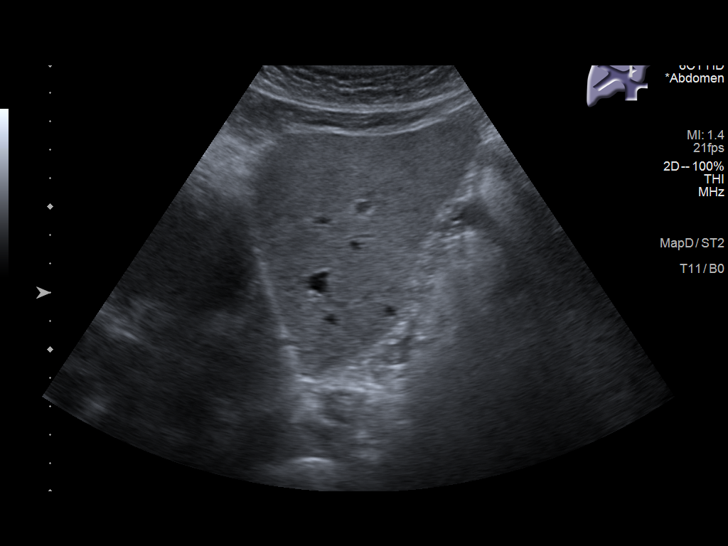
[im 31/58]
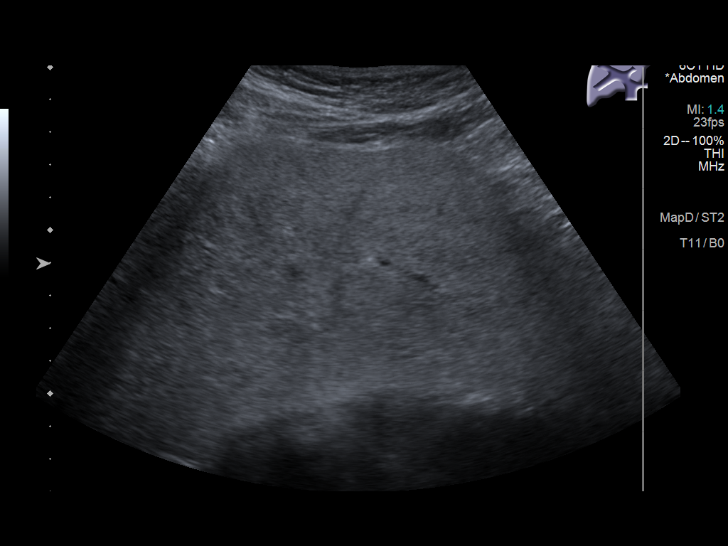
[im 36/58]
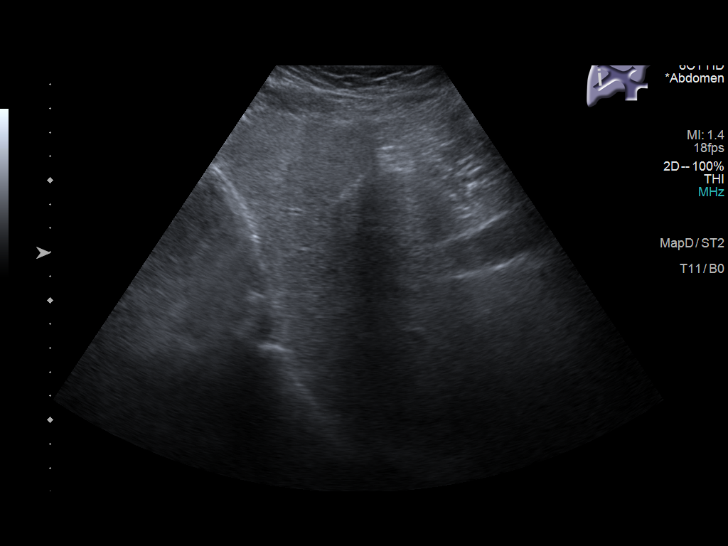
[im 39/58]
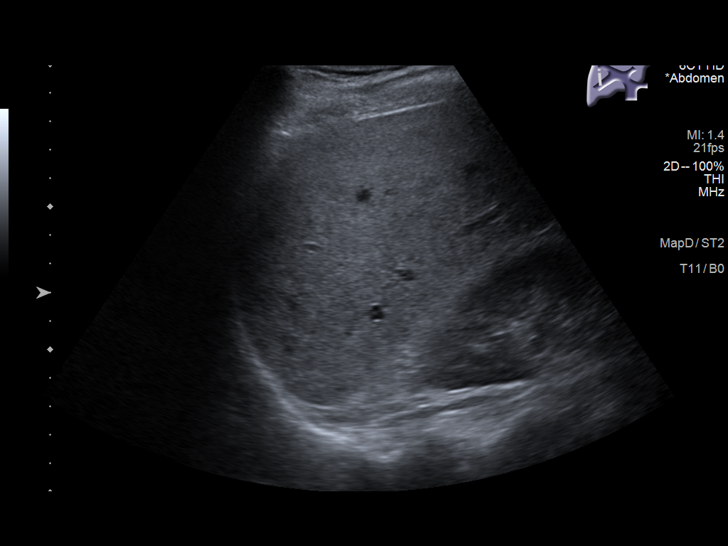
[im 43/58]
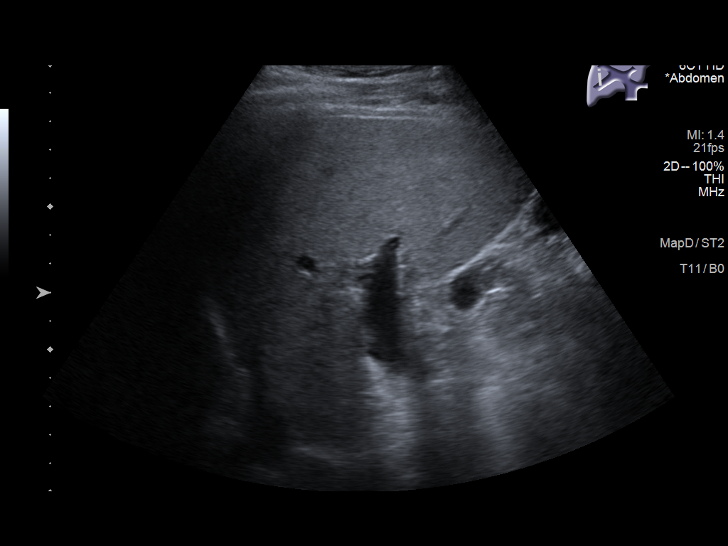
[im 48/58]
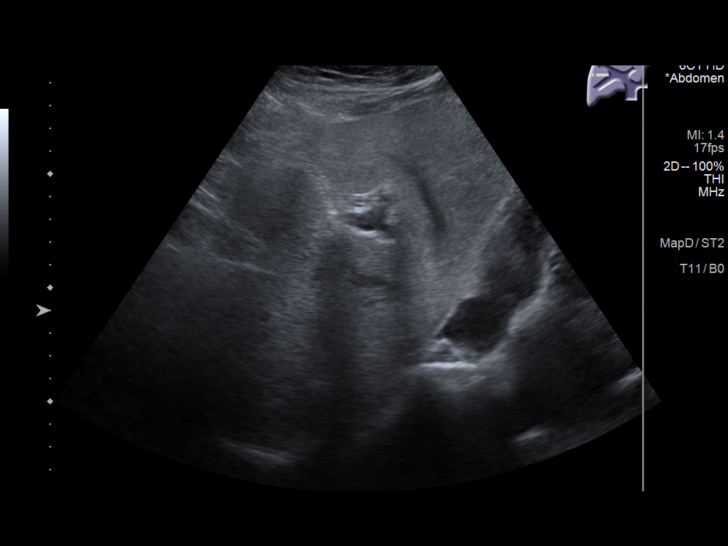
[im 53/58]
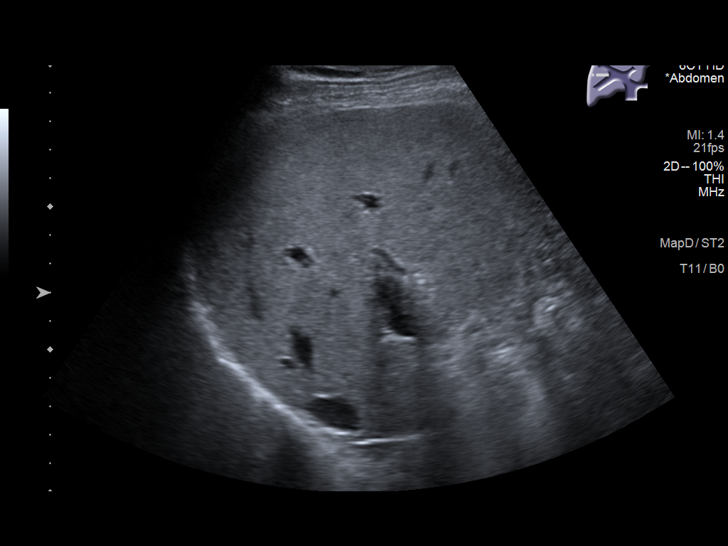
[im 58/58]
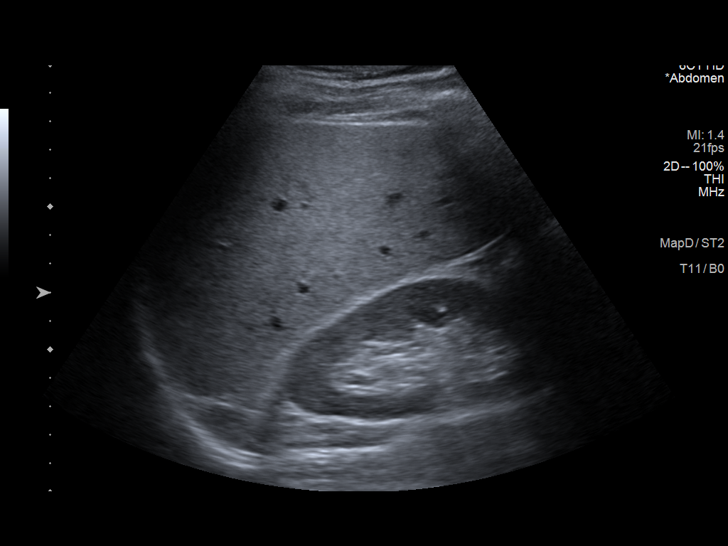

[14 of 25 positions shown; findings below may reference images not displayed]

FINDINGS: Gallbladder:

Mobile shadowing stone within the gallbladder lumen measuring 7.5 mm
in diameter. No pericholecystic fluid or wall thickening visualized.
No sonographic Murphy sign noted by sonographer.

Common bile duct:

Diameter: 3.5 mm.

Liver:

No focal lesion identified. Diffusely increased hepatic parenchymal
echogenicity. Portal vein is patent on color Doppler imaging with
normal direction of blood flow towards the liver.

Other: None.
IMPRESSION: 1. Cholelithiasis without sonographic evidence of acute
cholecystitis.
2. The echogenicity of the liver is increased. This is a nonspecific
finding but is most commonly seen with fatty infiltration of the
liver. There are no obvious focal liver lesions.
